# Patient Record
Sex: Female | Born: 1980 | Race: White | Hispanic: No | Marital: Single | Smoking: Light tobacco smoker
Health system: Southern US, Community
[De-identification: ages and names within clinical notes are randomized; demographics above are authoritative.]

## PROBLEM LIST (undated history)

## (undated) DIAGNOSIS — F32A Depression, unspecified: Secondary | ICD-10-CM

## (undated) DIAGNOSIS — J342 Deviated nasal septum: Secondary | ICD-10-CM

## (undated) DIAGNOSIS — D649 Anemia, unspecified: Secondary | ICD-10-CM

## (undated) DIAGNOSIS — J189 Pneumonia, unspecified organism: Secondary | ICD-10-CM

## (undated) DIAGNOSIS — G5792 Unspecified mononeuropathy of left lower limb: Secondary | ICD-10-CM

## (undated) DIAGNOSIS — K219 Gastro-esophageal reflux disease without esophagitis: Secondary | ICD-10-CM

## (undated) DIAGNOSIS — C801 Malignant (primary) neoplasm, unspecified: Secondary | ICD-10-CM

## (undated) DIAGNOSIS — F909 Attention-deficit hyperactivity disorder, unspecified type: Secondary | ICD-10-CM

## (undated) DIAGNOSIS — R002 Palpitations: Secondary | ICD-10-CM

## (undated) DIAGNOSIS — F410 Panic disorder [episodic paroxysmal anxiety] without agoraphobia: Secondary | ICD-10-CM

## (undated) DIAGNOSIS — I10 Essential (primary) hypertension: Secondary | ICD-10-CM

## (undated) DIAGNOSIS — T4145XA Adverse effect of unspecified anesthetic, initial encounter: Secondary | ICD-10-CM

## (undated) DIAGNOSIS — F988 Other specified behavioral and emotional disorders with onset usually occurring in childhood and adolescence: Secondary | ICD-10-CM

## (undated) DIAGNOSIS — T8859XA Other complications of anesthesia, initial encounter: Secondary | ICD-10-CM

## (undated) DIAGNOSIS — I00 Rheumatic fever without heart involvement: Secondary | ICD-10-CM

## (undated) DIAGNOSIS — G43909 Migraine, unspecified, not intractable, without status migrainosus: Secondary | ICD-10-CM

## (undated) DIAGNOSIS — M79605 Pain in left leg: Secondary | ICD-10-CM

## (undated) DIAGNOSIS — E162 Hypoglycemia, unspecified: Secondary | ICD-10-CM

## (undated) DIAGNOSIS — F329 Major depressive disorder, single episode, unspecified: Secondary | ICD-10-CM

## (undated) DIAGNOSIS — R011 Cardiac murmur, unspecified: Secondary | ICD-10-CM

## (undated) HISTORY — PX: COLPOSCOPY: SHX161

## (undated) HISTORY — PX: DILATION AND CURETTAGE OF UTERUS: SHX78

## (undated) HISTORY — DX: Major depressive disorder, single episode, unspecified: F32.9

## (undated) HISTORY — DX: Pain in left leg: M79.605

## (undated) HISTORY — PX: TUBAL LIGATION: SHX77

## (undated) HISTORY — DX: Depression, unspecified: F32.A

## (undated) HISTORY — PX: ABDOMINAL HYSTERECTOMY: SHX81

## (undated) HISTORY — PX: NASAL SEPTUM SURGERY: SHX37

---

## 2011-02-22 ENCOUNTER — Emergency Department (HOSPITAL_COMMUNITY): Payer: Self-pay

## 2011-02-22 ENCOUNTER — Encounter: Payer: Self-pay | Admitting: Emergency Medicine

## 2011-02-22 ENCOUNTER — Emergency Department (HOSPITAL_COMMUNITY)
Admission: EM | Admit: 2011-02-22 | Discharge: 2011-02-23 | Disposition: A | Discharge status: Home / Self Care | Payer: Self-pay | Attending: Emergency Medicine | Admitting: Emergency Medicine

## 2011-02-22 DIAGNOSIS — F17200 Nicotine dependence, unspecified, uncomplicated: Secondary | ICD-10-CM | POA: Insufficient documentation

## 2011-02-22 DIAGNOSIS — F411 Generalized anxiety disorder: Secondary | ICD-10-CM

## 2011-02-22 DIAGNOSIS — R030 Elevated blood-pressure reading, without diagnosis of hypertension: Secondary | ICD-10-CM

## 2011-02-22 DIAGNOSIS — F988 Other specified behavioral and emotional disorders with onset usually occurring in childhood and adolescence: Secondary | ICD-10-CM | POA: Insufficient documentation

## 2011-02-22 DIAGNOSIS — G43809 Other migraine, not intractable, without status migrainosus: Secondary | ICD-10-CM

## 2011-02-22 HISTORY — DX: Hypoglycemia, unspecified: E16.2

## 2011-02-22 HISTORY — DX: Other specified behavioral and emotional disorders with onset usually occurring in childhood and adolescence: F98.8

## 2011-02-22 HISTORY — DX: Panic disorder (episodic paroxysmal anxiety): F41.0

## 2011-02-22 HISTORY — DX: Migraine, unspecified, not intractable, without status migrainosus: G43.909

## 2011-02-22 HISTORY — DX: Rheumatic fever without heart involvement: I00

## 2011-02-22 LAB — URINALYSIS, ROUTINE W REFLEX MICROSCOPIC
Bilirubin Urine: NEGATIVE
Leukocytes, UA: NEGATIVE
Nitrite: NEGATIVE
Specific Gravity, Urine: 1.025 (ref 1.005–1.030)
Urobilinogen, UA: 0.2 mg/dL (ref 0.0–1.0)

## 2011-02-22 LAB — GLUCOSE, CAPILLARY

## 2011-02-22 LAB — POCT PREGNANCY, URINE: Preg Test, Ur: NEGATIVE

## 2011-02-22 MED ORDER — DIPHENHYDRAMINE HCL 12.5 MG/5ML PO ELIX
12.5000 mg | ORAL_SOLUTION | Freq: Once | ORAL | Status: AC
  Administered 2011-02-22: 12.5 mg via ORAL
  Filled 2011-02-22: qty 5

## 2011-02-22 MED ORDER — METOCLOPRAMIDE HCL 5 MG/ML IJ SOLN
10.0000 mg | Freq: Once | INTRAMUSCULAR | Status: AC
  Administered 2011-02-22: 10 mg via INTRAVENOUS
  Filled 2011-02-22: qty 2

## 2011-02-22 NOTE — ED Notes (Signed)
Patient's significant other report patient began c/o right arm numbness, slurred speech, and expressive aphasia. Symptom onset 2130. Patient with slow, slurred speech, eyes drooping, and very flat affect and appears lethargic. Patient reports it is hard for her words to come out.  20 gauge IV initiated in LAC. Blood work drawn upon IV initiation.   Dr Oletta Lamas at bedside for patient evaluation.

## 2011-02-22 NOTE — ED Notes (Signed)
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

## 2011-02-22 NOTE — ED Notes (Signed)
  Pt transported to ct 

## 2011-02-22 NOTE — ED Provider Notes (Addendum)
History    Scribed for Gavin Pound. Trinitey Roache, MD, the patient was seen in room APA17/APA17. This chart was scribed by Katha Cabal. This patient's care was started at 10:30PM.   CSN: [id number] Arrival date & time: 02/22/2011 10:05 PM  Chief Complaint  Patient presents with  . Hypertension  . Aphasia   HPI  A 30 year old female patient complains of confusion, Right hand tremors, mild aphasia onset an hour ago while at dinner. Pt also notes blurred vision both eyes and tingling in right arm and right leg, hot flashes, clammy, and pruritus, palpitations,  Dysphagia, chest tightness relieved with deep breathing.  Denies HA, change in gate, SOB, illness over the past week, increased stress.  Spouse noted slurred speech on the ride to emergency department.  Pt has history of migriaines and anxiety but Sx are not similar to current episode.  Pt also notes history of emotional panic attacks that she required medication when she was 11 years old.  Father had MI in his 79's. LMP 02/05/2011.  PAST MEDICAL HISTORY:  Past Medical History  Diagnosis Date  . ADD (attention deficit disorder)   . Rheumatic fever   . Hypoglycemia     PAST SURGICAL HISTORY:  Tubal Ligation   MEDICATIONS:  Previous Medications   Adderol     ALLERGIES:  Allergies as of 02/22/2011  . (Not on File)     FAMILY HISTORY:  Father with MI in 42's.  Mother with hypercholesteremia.   SOCIAL HISTORY: History   Social History  . Marital Status: Single    Spouse Name: N/A    Number of Children: 3  . Years of Education: N/A   Social History Main Topics  . Smoking status: Current Everyday Smoker -- 1.0 packs/day    Types: Cigarettes  . Smokeless tobacco: None  . Alcohol Use: Yes     occ  . Drug Use: No  . Sexually Active: Yes    Birth Control/ Protection: None   Other Topics Concern  . None   Social History Narrative  . None    Review of Systems 10 Systems reviewed and are negative for acute change except  as noted in the HPI. Physical Exam  BP 166/112  Pulse 98  Temp(Src) 98 F (36.7 C) (Oral)  Resp 20  Ht 5\' 6"  (1.676 m)  Wt 150 lb (68.04 kg)  BMI 24.21 kg/m2  SpO2 97%  Physical Exam  Nursing note and vitals reviewed. Constitutional: She is oriented to person, place, and time. She appears well-developed and well-nourished.  HENT:  Head: Normocephalic and atraumatic.  Mouth/Throat: Oropharynx is clear and moist.  Eyes: EOM are normal. Pupils are equal, round, and reactive to light.  Neck: Neck supple.  Cardiovascular: Normal rate, regular rhythm and normal heart sounds.   No murmur heard. Pulmonary/Chest: Effort normal and breath sounds normal. She has no wheezes.  Abdominal: Soft. Bowel sounds are normal. She exhibits no distension. There is no tenderness.  Musculoskeletal: Normal range of motion. She exhibits no edema.       DP and radial pulses intact.   Lymphadenopathy:    She has no cervical adenopathy.  Neurological: She is oriented to person, place, and time.       3/5 strength upper extremities  4/5 strength lower extremities   Skin: Skin is warm and dry.  Psychiatric: She has a normal mood and affect. Her behavior is normal.    ED Course  Procedures  . OTHER DATA REVIEWED:  Nursing notes, vital signs, and past medical records reviewed.    DIAGNOSTIC STUDIES:     LABS / RADIOLOGY: Results for orders placed during the hospital encounter of 02/22/11  GLUCOSE, CAPILLARY      Component Value Range   Glucose-Capillary 107 (*) 70 - 99 (mg/dL)   Comment 1 Documented in Chart     Comment 2 Notify RN    URINALYSIS, ROUTINE W REFLEX MICROSCOPIC      Component Value Range   Color, Urine YELLOW  YELLOW    Appearance CLEAR  CLEAR    Specific Gravity, Urine 1.025  1.005 - 1.030    pH 6.0  5.0 - 8.0    Glucose, UA NEGATIVE  NEGATIVE (mg/dL)   Hgb urine dipstick NEGATIVE  NEGATIVE    Bilirubin Urine NEGATIVE  NEGATIVE    Ketones, ur NEGATIVE  NEGATIVE (mg/dL)    Protein, ur NEGATIVE  NEGATIVE (mg/dL)   Urobilinogen, UA 0.2  0.0 - 1.0 (mg/dL)   Nitrite NEGATIVE  NEGATIVE    Leukocytes, UA NEGATIVE  NEGATIVE   POCT PREGNANCY, URINE      Component Value Range   Preg Test, Ur NEGATIVE         ED COURSE / COORDINATION OF CARE: 11:42 PM Spouse came to me and reports pt is already feeling better and is able to move her legs much better than even in the room from before and seems to be thinking clearer.  If CT scan is ok and pt is resolved in her symptoms, will d/c home.     MDM: The patient presents with atypical symptoms of possible neurologic episode. The patient was eating dinner with spells about one to 2 hours prior and had sudden onset of some difficulty swallowing associated with right arm tremors and subjective numbness as well as pain. The patient had a steady gait. The patient did have some slight slurring or difficulty speaking per spouse while at dinner. En route to the hospital the patient reports some bilateral eye blurred vision. On exam here in the emergency department the patient has bilateral weakness with poor effort both in testing upper and lower extremities. Patient is noted to have some tremors of the right hand although at times the patient has normal purposeful movement of the right arm arm when she scratched her left arm. Initially on exam patient has quite poor effort when testing for strength but with encouragement the patient did display some weak strength in both arms and legs symmetrically. The patient also has very few risk factors for stroke including her young age and no significant family history of strokes. The patient does smoke, she is not on any birth control hormone replacement. Given her exam I have very low clinical suspicion for any type of stroke or seizure. The patient does have a history of migraines as well as anxiety and panic attacks when she was younger. Both of these conditions may cause supple neurologic cold  symptoms as displayed here today. After discussing my suspicions with the patient and her spouse the patient does seem more relaxed and in my opinion her movements and concentration seems to be improving. I do not feel the patient is a TPA candidate as I have very low suspicion that this is a true cerebrovascular accident. I will have her perform a noncontrast head CT as well as treat her for presumptive atypical migraine without specific headache. We'll continue to observe her. Her initial Accu-Chek urinalysis and pregnancy test were negative.  12:11 AM I reviewed pt's head CT and per radiologist, head CT scan is normal.    12:18 AM Pt is now laughing and joking with spouse in the room, no noticeable tremors, reports she feels 10 times better, no headache, no blurred vision, no subjective numbness.  Also repeat of BP reading is improved although not completely normal.  I suspect pt may have had combination of stress and possibly atypical complex migraine.  If pt can ambulate fine, will d/c home.    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  Her tremors could have been related to that.  I personally performed the services described in this documentation, which was scribed in my presence. The recorded information has been reviewed and considered. Gavin Pound. Oletta Lamas, MD   Gavin Pound. Oletta Lamas, MD 02/23/11 1610  Gavin Pound. Oletta Lamas, MD 02/23/11 [id number]

## 2011-02-23 ENCOUNTER — Encounter (HOSPITAL_COMMUNITY): Payer: Self-pay | Admitting: Emergency Medicine

## 2011-02-23 NOTE — Discharge Instructions (Signed)
 Your head CT scan was normal.  I suspect you had a variant of a migraine that can cause people to shows signs and symptoms of a stroke.  I recommend that you have your blood pressure checked again in a few days when you are feeling normal and not under any stress, preferably at your doctor's office.  If you develop new symptoms, please call your doctor or return if needed.

## 2011-02-23 NOTE — ED Notes (Signed)
Pt ambulated to bathroom without incident.

## 2011-02-26 ENCOUNTER — Encounter (HOSPITAL_COMMUNITY): Payer: Self-pay | Admitting: *Deleted

## 2012-09-13 IMAGING — CT CT HEAD W/O CM
1 series · 16 of 30 positions shown, 20 images · non-contrast
Comparison: None.

CLINICAL DATA: Hypertension.  Difficulty speaking and right arm
numbness.

CT HEAD WITHOUT CONTRAST
TECHNIQUE: Contiguous axial images were obtained from the base of
the skull through the vertex without contrast.

[Series 3: headseq 4.8 h37s · axial · 0.43mm/px · z∈[-649,-495]mm · 16 of 36 slices shown, 20 images]
[im 2/36  brain]
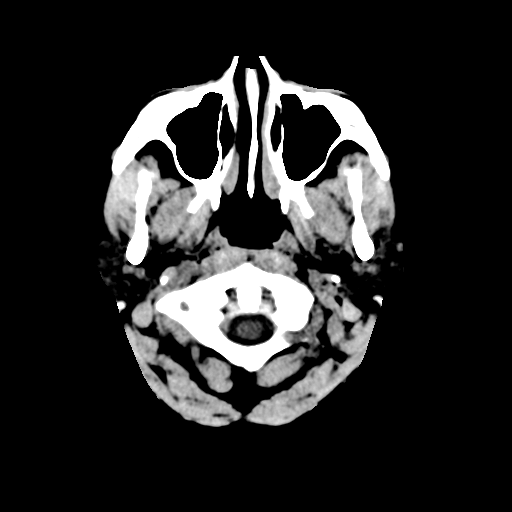
[im 2/36  bone]
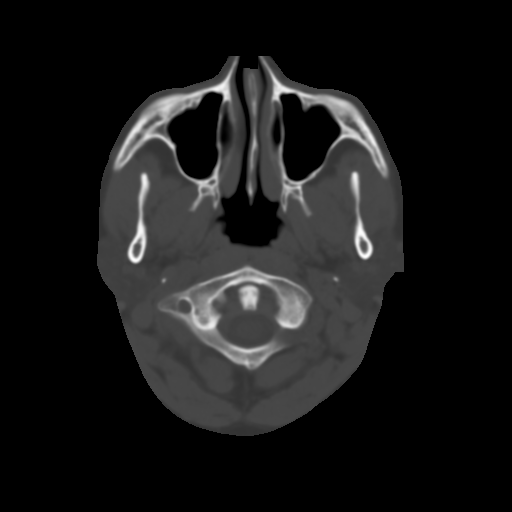
[im 4/36  brain]
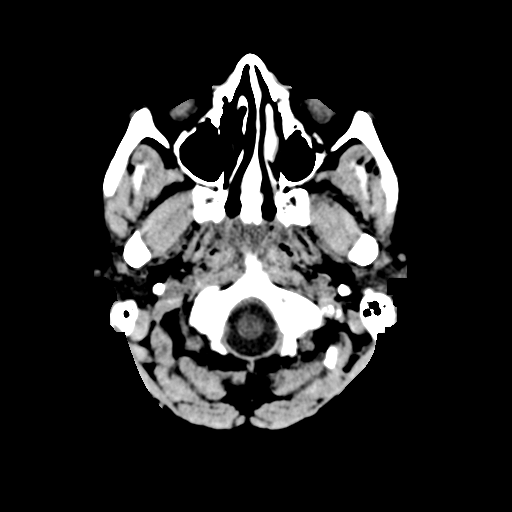
[im 7/36  brain]
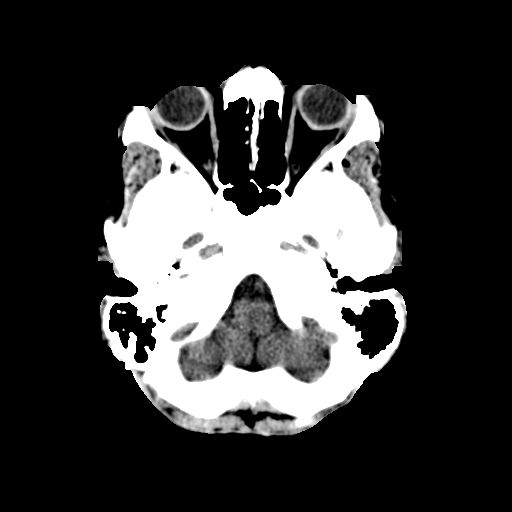
[im 9/36  brain]
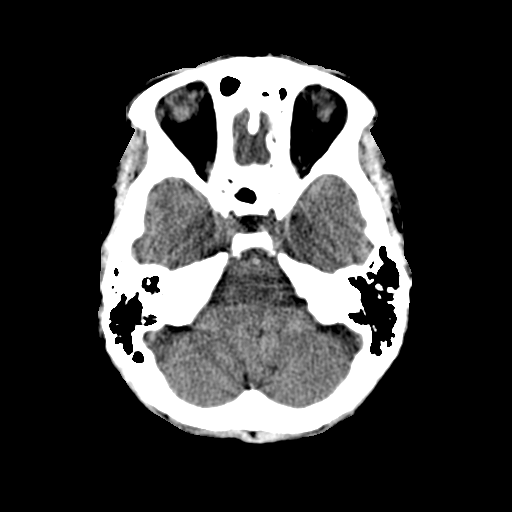
[im 10/36  brain]
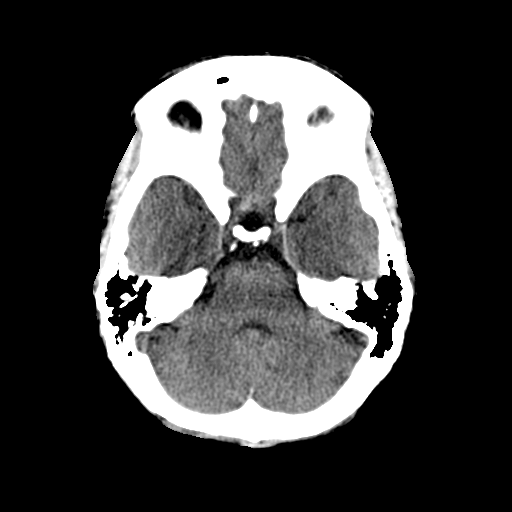
[im 10/36  bone]
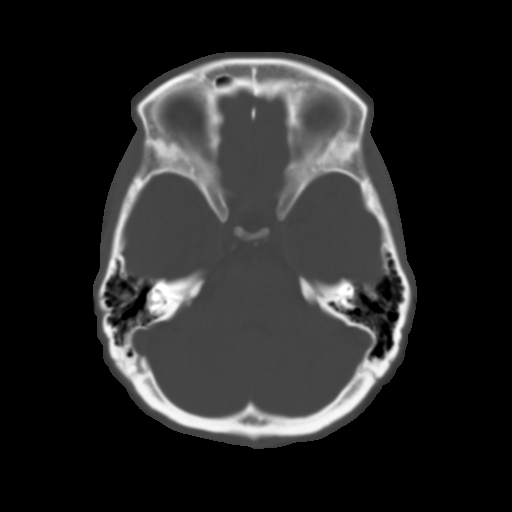
[im 13/36  brain]
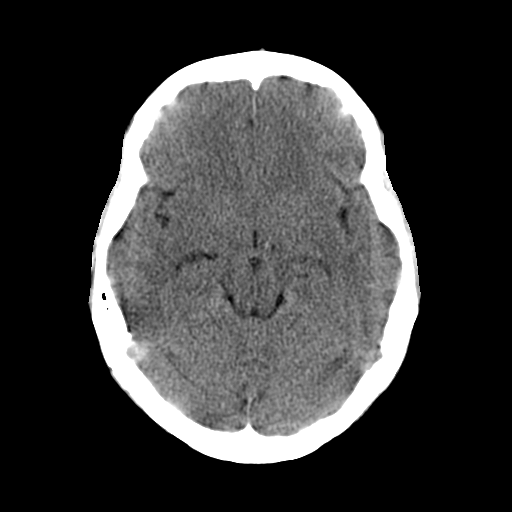
[im 15/36  brain]
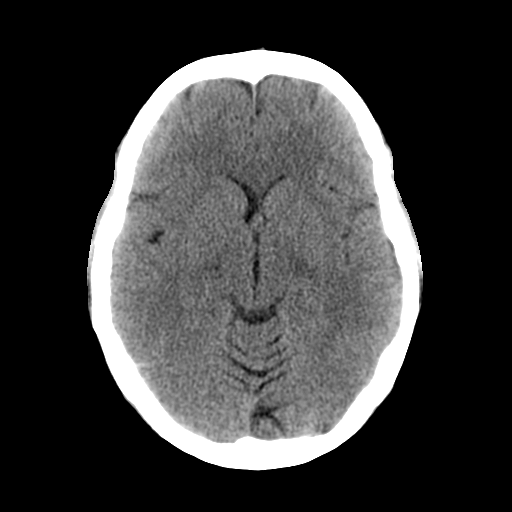
[im 17/36  brain]
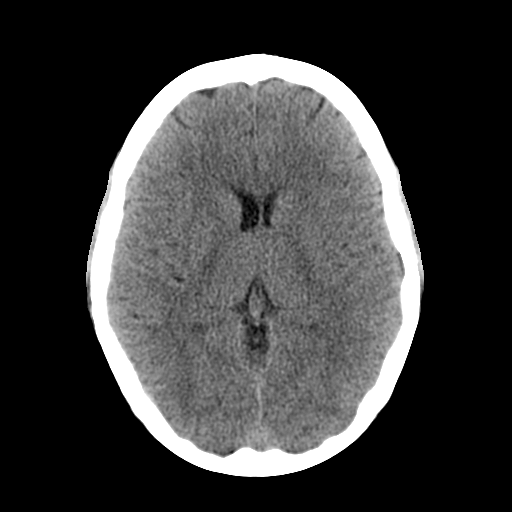
[im 19/36  brain]
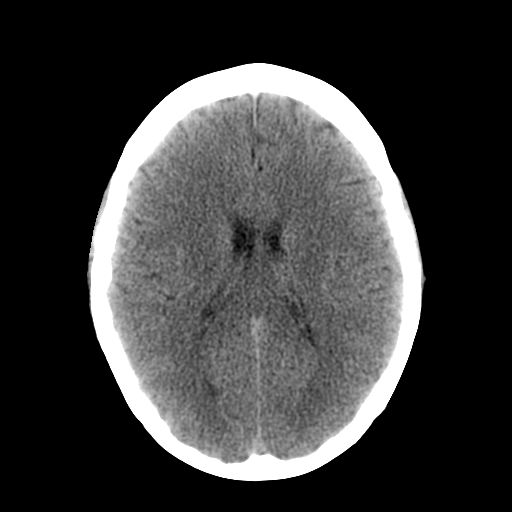
[im 19/36  bone]
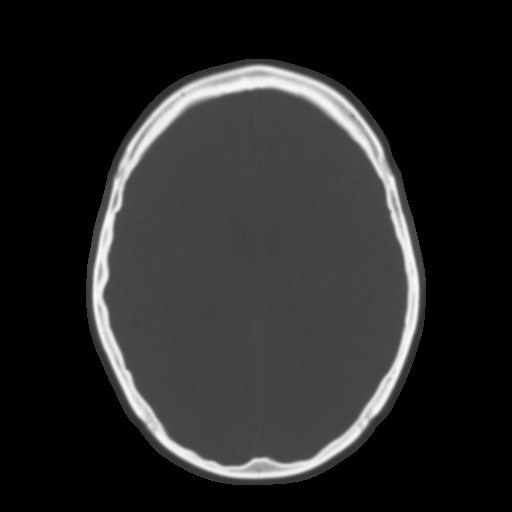
[im 21/36  brain]
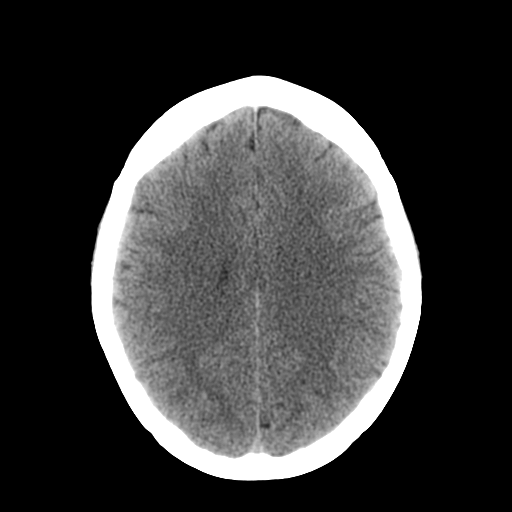
[im 23/36  brain]
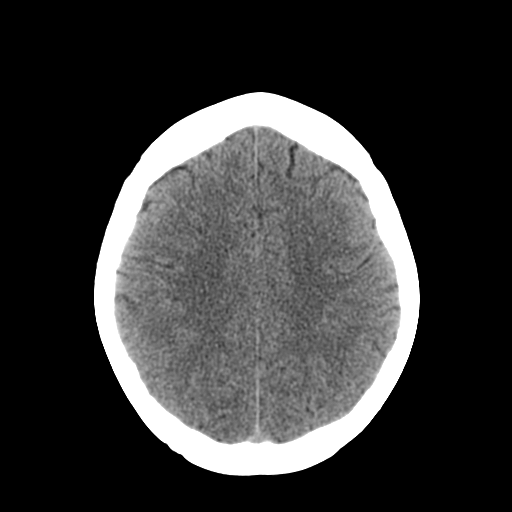
[im 26/36  brain]
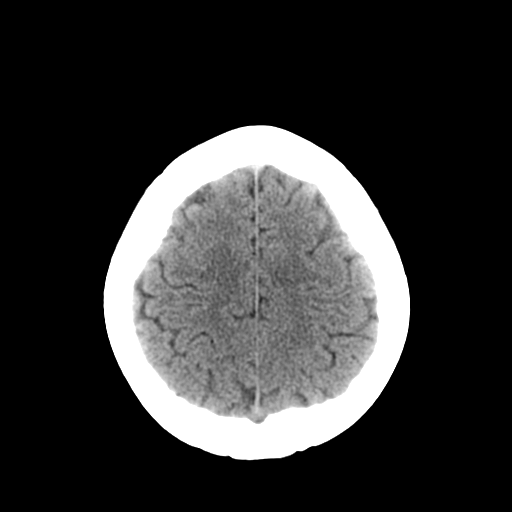
[im 27/36  brain]
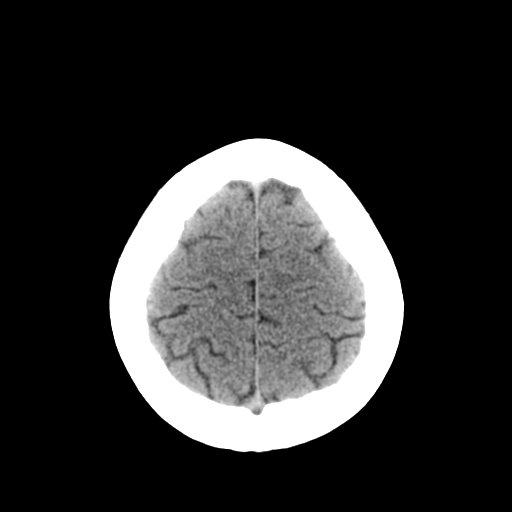
[im 27/36  bone]
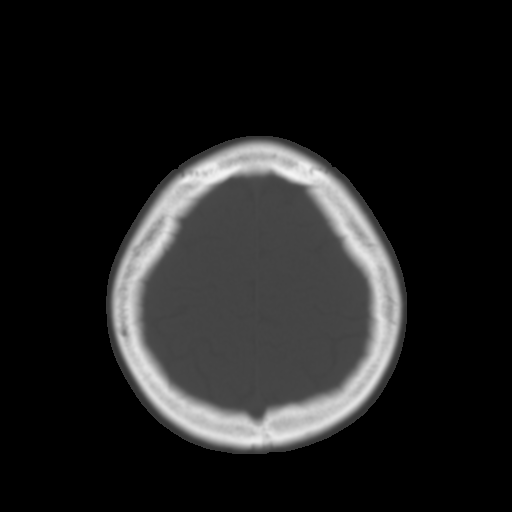
[im 29/36  brain]
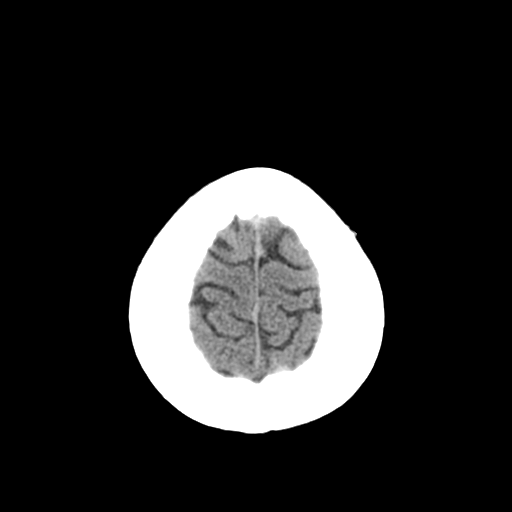
[im 32/36  brain]
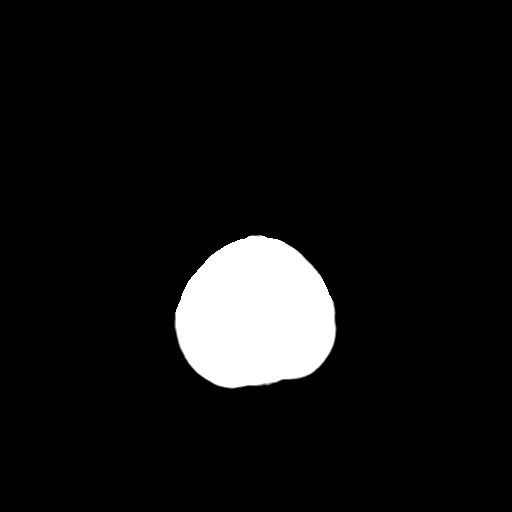
[im 34/36  brain]
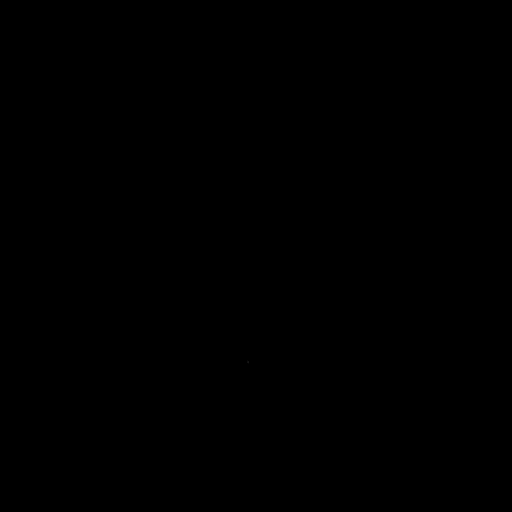

[16 of 30 positions shown; findings below may reference images not displayed]

FINDINGS: The brain appears normal without evidence of acute
infarction, hemorrhage, mass lesion, mass effect, midline shift or
abnormal extra-axial fluid collection.  No pneumocephalus or
hydrocephalus.  Imaged paranasal sinuses and mastoid air cells
clear.  Calvarium intact.
IMPRESSION: Normal exam.

## 2013-04-28 ENCOUNTER — Encounter (HOSPITAL_COMMUNITY): Payer: Self-pay | Admitting: Emergency Medicine

## 2013-04-28 ENCOUNTER — Emergency Department (HOSPITAL_COMMUNITY)
Admission: EM | Admit: 2013-04-28 | Discharge: 2013-04-28 | Disposition: A | Discharge status: Home / Self Care | Payer: Self-pay | Attending: Emergency Medicine | Admitting: Emergency Medicine

## 2013-04-28 DIAGNOSIS — R6884 Jaw pain: Secondary | ICD-10-CM | POA: Insufficient documentation

## 2013-04-28 DIAGNOSIS — Z3202 Encounter for pregnancy test, result negative: Secondary | ICD-10-CM | POA: Insufficient documentation

## 2013-04-28 DIAGNOSIS — Z8639 Personal history of other endocrine, nutritional and metabolic disease: Secondary | ICD-10-CM | POA: Insufficient documentation

## 2013-04-28 DIAGNOSIS — H60399 Other infective otitis externa, unspecified ear: Secondary | ICD-10-CM | POA: Insufficient documentation

## 2013-04-28 DIAGNOSIS — Z8659 Personal history of other mental and behavioral disorders: Secondary | ICD-10-CM | POA: Insufficient documentation

## 2013-04-28 DIAGNOSIS — N390 Urinary tract infection, site not specified: Secondary | ICD-10-CM | POA: Insufficient documentation

## 2013-04-28 DIAGNOSIS — H6091 Unspecified otitis externa, right ear: Secondary | ICD-10-CM

## 2013-04-28 DIAGNOSIS — Z8679 Personal history of other diseases of the circulatory system: Secondary | ICD-10-CM | POA: Insufficient documentation

## 2013-04-28 DIAGNOSIS — F17200 Nicotine dependence, unspecified, uncomplicated: Secondary | ICD-10-CM | POA: Insufficient documentation

## 2013-04-28 DIAGNOSIS — Z862 Personal history of diseases of the blood and blood-forming organs and certain disorders involving the immune mechanism: Secondary | ICD-10-CM | POA: Insufficient documentation

## 2013-04-28 LAB — URINALYSIS, ROUTINE W REFLEX MICROSCOPIC
Glucose, UA: NEGATIVE mg/dL
Ketones, ur: NEGATIVE mg/dL
Protein, ur: NEGATIVE mg/dL
Urobilinogen, UA: 0.2 mg/dL (ref 0.0–1.0)

## 2013-04-28 LAB — URINE MICROSCOPIC-ADD ON

## 2013-04-28 MED ORDER — NEOMYCIN-POLYMYXIN-HC 1 % OT SOLN
3.0000 [drp] | Freq: Four times a day (QID) | OTIC | Status: DC
  Administered 2013-04-28: 3 [drp] via OTIC

## 2013-04-28 MED ORDER — NEOMYCIN-POLYMYXIN-HC 1 % OT SOLN
OTIC | Status: AC
  Filled 2013-04-28: qty 10

## 2013-04-28 MED ORDER — HYDROCODONE-ACETAMINOPHEN 5-325 MG PO TABS: 1.0000 | ORAL_TABLET | ORAL | Status: DC | PRN

## 2013-04-28 MED ORDER — DICLOFENAC SODIUM 75 MG PO TBEC: 75.0000 mg | DELAYED_RELEASE_TABLET | Freq: Two times a day (BID) | ORAL | Status: DC

## 2013-04-28 MED ORDER — CEPHALEXIN 500 MG PO CAPS: 500.0000 mg | ORAL_CAPSULE | Freq: Four times a day (QID) | ORAL | Status: DC

## 2013-04-28 MED ORDER — HYDROCODONE-ACETAMINOPHEN 5-325 MG PO TABS
1.0000 | ORAL_TABLET | Freq: Once | ORAL | Status: AC
  Administered 2013-04-28: 1 via ORAL
  Filled 2013-04-28: qty 1

## 2013-04-28 MED ORDER — KETOROLAC TROMETHAMINE 10 MG PO TABS
10.0000 mg | ORAL_TABLET | Freq: Once | ORAL | Status: AC
  Administered 2013-04-28: 10 mg via ORAL
  Filled 2013-04-28: qty 1

## 2013-04-28 MED ORDER — CEPHALEXIN 500 MG PO CAPS
500.0000 mg | ORAL_CAPSULE | Freq: Once | ORAL | Status: AC
  Administered 2013-04-28: 500 mg via ORAL
  Filled 2013-04-28: qty 1

## 2013-04-28 NOTE — ED Notes (Signed)
Pt c/o rt sided jaw pain and ear pain. Pt also c/o dysuria for a couple of days.

## 2013-04-28 NOTE — ED Provider Notes (Signed)
Medical screening examination/treatment/procedure(s) were performed by non-physician practitioner and as supervising physician I was immediately available for consultation/collaboration.   Shelda Jakes, MD 04/28/13 2026

## 2013-04-28 NOTE — ED Provider Notes (Signed)
CSN: [id number]     Arrival date & time 04/28/13  1839 History   First MD Initiated Contact with Patient 04/28/13 1932     Chief Complaint  Patient presents with  . Jaw Pain  . Otalgia  . Dysuria   (Consider location/radiation/quality/duration/timing/severity/associated sxs/prior Treatment) Patient is a 32 y.o. female presenting with ear pain and dysuria. The history is provided by the patient.  Otalgia Location:  Right Behind ear:  No abnormality Quality:  Aching Severity:  Moderate Onset quality:  Gradual Duration:  4 days Timing:  Intermittent Progression:  Worsening Chronicity:  New Context: not direct blow, not foreign body in ear and not loud noise   Relieved by:  Nothing Worsened by:  Nothing tried Ineffective treatments:  None tried Associated symptoms: congestion   Associated symptoms: no abdominal pain, no cough, no fever and no neck pain   Associated symptoms comment:  Right jaw pain. Risk factors: no recent travel   Dysuria Associated symptoms: no abdominal pain and no fever     Past Medical History  Diagnosis Date  . ADD (attention deficit disorder)   . Rheumatic fever   . Hypoglycemia   . Panic attack   . Migraine headache    Past Surgical History  Procedure Laterality Date  . Tubal ligation    . Dilation and curettage of uterus     History reviewed. No pertinent family history. History  Substance Use Topics  . Smoking status: Current Every Day Smoker -- 1.00 packs/day    Types: Cigarettes  . Smokeless tobacco: Not on file  . Alcohol Use: No     Comment: occ   OB History   Grav Para Term Preterm Abortions TAB SAB Ect Mult Living                 Review of Systems  Constitutional: Negative for fever and activity change.       All ROS Neg except as noted in HPI  HENT: Positive for congestion and ear pain. Negative for nosebleeds.   Eyes: Negative for photophobia and discharge.  Respiratory: Negative for cough, shortness of breath and  wheezing.   Cardiovascular: Negative for chest pain and palpitations.  Gastrointestinal: Negative for abdominal pain and blood in stool.  Genitourinary: Positive for dysuria. Negative for frequency and hematuria.  Musculoskeletal: Positive for arthralgias. Negative for back pain and neck pain.  Skin: Negative.   Neurological: Negative for dizziness, seizures and speech difficulty.  Psychiatric/Behavioral: Negative for hallucinations and confusion. The patient is nervous/anxious.     Allergies  Review of patient's allergies indicates no known allergies.  Home Medications  No current outpatient prescriptions on file. BP 136/92  Pulse 68  Temp(Src) 98 F (36.7 C) (Oral)  Resp 18  SpO2 99%  LMP 04/06/2013 Physical Exam  Nursing note and vitals reviewed. Constitutional: She is oriented to person, place, and time. She appears well-developed and well-nourished.  Non-toxic appearance.  HENT:  Head: Normocephalic.    Right Ear: Tympanic membrane and external ear normal.  Left Ear: Tympanic membrane and external ear normal.  Ears:  There is mild increased redness just inside the external auditory canal at the 3:00 position. There is also question of a small red area at the tip of the tragus. There is no pain to the angle of the jaw. There is no pain to palpation or percussion of the teeth of the upper or lower jaw on the right.  Eyes: EOM and lids are normal. Pupils are  equal, round, and reactive to light.  Neck: Normal range of motion. Neck supple. Carotid bruit is not present.  Cardiovascular: Normal rate, regular rhythm, normal heart sounds, intact distal pulses and normal pulses.   Pulmonary/Chest: Breath sounds normal. No respiratory distress.  Abdominal: Soft. Bowel sounds are normal. There is no tenderness. There is no guarding.  Musculoskeletal: Normal range of motion.  Lymphadenopathy:       Head (right side): No submandibular adenopathy present.       Head (left side): No  submandibular adenopathy present.    She has no cervical adenopathy.  Neurological: She is alert and oriented to person, place, and time. She has normal strength. No cranial nerve deficit or sensory deficit.  Skin: Skin is warm and dry.  Psychiatric: She has a normal mood and affect. Her speech is normal.    ED Course  Procedures (including critical care time) Labs Review Labs Reviewed  URINALYSIS, ROUTINE W REFLEX MICROSCOPIC - Abnormal; Notable for the following:    Specific Gravity, Urine >1.030 (*)    Leukocytes, UA TRACE (*)    All other components within normal limits  URINE MICROSCOPIC-ADD ON - Abnormal; Notable for the following:    Squamous Epithelial / LPF FEW (*)    Bacteria, UA MANY (*)    All other components within normal limits  URINE CULTURE  PREGNANCY, URINE   Imaging Review No results found.  EKG Interpretation   None       MDM  No diagnosis found. *I have reviewed nursing notes, vital signs, and all appropriate lab and imaging results for this patient.** Labs reviewed. Pt noted to have UTI.                                                                                                       Pt advised to ciprofloxacin otic drops 2 times daily to the right ear. She is to use diclofenac 2 times daily with food. She will use Norco for pain, and Keflex 4 times daily for infection. Patient was advised of urinary tract infection being present, and advised to have urine rechecked in about 10 days.   Kathie Dike, PA-C 04/28/13 2025

## 2013-04-30 LAB — URINE CULTURE

## 2013-05-02 ENCOUNTER — Telehealth (HOSPITAL_COMMUNITY): Payer: Self-pay | Admitting: Emergency Medicine

## 2013-05-02 NOTE — ED Notes (Signed)
Post ED Visit - Positive Culture Follow-up  Culture report reviewed by antimicrobial stewardship pharmacist: []  Wes Dulaney, Pharm.D., BCPS []  Celedonio Miyamoto, Pharm.D., BCPS []  Georgina Pillion, Pharm.D., BCPS []  Amanda, [address].D., BCPS, AAHIVP []  Estella Husk, Pharm.D., BCPS, AAHIVP [x]  Nadara Mustard, [address].D., BCPS  Positive urine culture Treated with Keflex, organism sensitive to the same and no further patient follow-up is required at this time.  Kylie A Holland 05/02/2013, 9:43 AM

## 2013-05-15 ENCOUNTER — Encounter (HOSPITAL_COMMUNITY): Payer: Self-pay | Admitting: Emergency Medicine

## 2013-05-15 ENCOUNTER — Emergency Department (HOSPITAL_COMMUNITY)
Admission: EM | Admit: 2013-05-15 | Discharge: 2013-05-15 | Disposition: A | Discharge status: Home / Self Care | Payer: Self-pay | Attending: Emergency Medicine | Admitting: Emergency Medicine

## 2013-05-15 DIAGNOSIS — Z8659 Personal history of other mental and behavioral disorders: Secondary | ICD-10-CM | POA: Insufficient documentation

## 2013-05-15 DIAGNOSIS — Z8679 Personal history of other diseases of the circulatory system: Secondary | ICD-10-CM | POA: Insufficient documentation

## 2013-05-15 DIAGNOSIS — E669 Obesity, unspecified: Secondary | ICD-10-CM | POA: Insufficient documentation

## 2013-05-15 DIAGNOSIS — G43909 Migraine, unspecified, not intractable, without status migrainosus: Secondary | ICD-10-CM | POA: Insufficient documentation

## 2013-05-15 DIAGNOSIS — Z79899 Other long term (current) drug therapy: Secondary | ICD-10-CM | POA: Insufficient documentation

## 2013-05-15 DIAGNOSIS — F17200 Nicotine dependence, unspecified, uncomplicated: Secondary | ICD-10-CM | POA: Insufficient documentation

## 2013-05-15 DIAGNOSIS — J039 Acute tonsillitis, unspecified: Secondary | ICD-10-CM | POA: Insufficient documentation

## 2013-05-15 DIAGNOSIS — Z792 Long term (current) use of antibiotics: Secondary | ICD-10-CM | POA: Insufficient documentation

## 2013-05-15 MED ORDER — CLINDAMYCIN HCL 150 MG PO CAPS: 150.0000 mg | ORAL_CAPSULE | Freq: Four times a day (QID) | ORAL | Status: DC

## 2013-05-15 MED ORDER — LIDOCAINE HCL (PF) 1 % IJ SOLN
INTRAMUSCULAR | Status: AC
  Administered 2013-05-15: 15:00:00
  Filled 2013-05-15: qty 5

## 2013-05-15 MED ORDER — OXYCODONE-ACETAMINOPHEN 5-325 MG PO TABS
1.0000 | ORAL_TABLET | Freq: Once | ORAL | Status: AC
  Administered 2013-05-15: 1 via ORAL
  Filled 2013-05-15: qty 1

## 2013-05-15 MED ORDER — HYDROCODONE-ACETAMINOPHEN 5-325 MG PO TABS: 1.0000 | ORAL_TABLET | ORAL | Status: DC | PRN

## 2013-05-15 MED ORDER — CEFTRIAXONE SODIUM 1 G IJ SOLR
1.0000 g | Freq: Once | INTRAMUSCULAR | Status: AC
  Administered 2013-05-15: 1 g via INTRAMUSCULAR
  Filled 2013-05-15: qty 10

## 2013-05-15 NOTE — ED Notes (Signed)
Pt c/o sore throat and generalized body aches, and fever x 2 days.  Reports has been around people that have traveled internationally but none to [place].  Reports was seen here a couple of weeks ago for r earache.

## 2013-05-23 NOTE — ED Provider Notes (Signed)
CSN: [id number]     Arrival date & time 05/15/13  1332 History   First MD Initiated Contact with Patient 05/15/13 1409     Chief Complaint  Patient presents with  . Sore Throat   (Consider location/radiation/quality/duration/timing/severity/associated sxs/prior Treatment) HPI 32 y.o female complaining of sore throat for several days.  She was seen here with ear pain last week and has been taking clindamycin.  She has had some subjective fever.  Pain with speaking and swallowing that is not lateralized. No change in voice, lateral swelling, or neck stiffness.   Past Medical History  Diagnosis Date  . ADD (attention deficit disorder)   . Rheumatic fever   . Hypoglycemia   . Panic attack   . Migraine headache    Past Surgical History  Procedure Laterality Date  . Tubal ligation    . Dilation and curettage of uterus     No family history on file. History  Substance Use Topics  . Smoking status: Current Every Day Smoker -- 1.00 packs/day    Types: Cigarettes  . Smokeless tobacco: Not on file  . Alcohol Use: No     Comment: occ   OB History   Grav Para Term Preterm Abortions TAB SAB Ect Mult Living                 Review of Systems  All other systems reviewed and are negative.    Allergies  Review of patient's allergies indicates no known allergies.  Home Medications   Current Outpatient Rx  Name  Route  Sig  Dispense  Refill  . cephALEXin (KEFLEX) 500 MG capsule   Oral   Take 1 capsule (500 mg total) by mouth 4 (four) times daily.   28 capsule   0   . diclofenac (VOLTAREN) 75 MG EC tablet   Oral   Take 1 tablet (75 mg total) by mouth 2 (two) times daily.   12 tablet   0   . ibuprofen (ADVIL,MOTRIN) 200 MG tablet   Oral   Take 400 mg by mouth every 6 (six) hours as needed for pain.         . clindamycin (CLEOCIN) 150 MG capsule   Oral   Take 1 capsule (150 mg total) by mouth every 6 (six) hours.   28 capsule   0   . HYDROcodone-acetaminophen  (NORCO/VICODIN) 5-325 MG per tablet   Oral   Take 1 tablet by mouth every 4 (four) hours as needed.   15 tablet   0    BP 137/86  Pulse 99  Temp(Src) 98.2 F (36.8 C) (Oral)  Resp 20  Ht 5\' 3"  (1.6 m)  Wt 138 lb (62.596 kg)  BMI 24.45 kg/m2  SpO2 99%  LMP 05/01/2013 Physical Exam  Nursing note and vitals reviewed. Constitutional: She is oriented to person, place, and time. She appears well-developed and well-nourished.  obese  HENT:  Head: Normocephalic and atraumatic.  Right Ear: Tympanic membrane and external ear normal.  Left Ear: Tympanic membrane normal.  Nose: Nose normal.  Mouth/Throat: Oropharynx is clear and moist.  Mild pharyngeal erythema  Eyes: Conjunctivae and EOM are normal. Pupils are equal, round, and reactive to light.  Neck: Normal range of motion. Neck supple. No JVD present. No tracheal deviation present.  Cardiovascular: Normal rate, regular rhythm, normal heart sounds and intact distal pulses.   Pulmonary/Chest: Effort normal and breath sounds normal. No stridor.  Abdominal: Bowel sounds are normal.  Musculoskeletal: Normal range  of motion.  Lymphadenopathy:    She has no cervical adenopathy.  Neurological: She is oriented to person, place, and time.  Skin: Skin is warm and dry.  Psychiatric: She has a normal mood and affect. Her behavior is normal. Judgment and thought content normal.    ED Course  Procedures (including critical care time) Labs Review Labs Reviewed - No data to display Imaging Review No results found.  EKG Interpretation   None       MDM   1. Tonsillitis with exudate       Hilario Quarry, MD 05/23/13 1003

## 2016-02-17 ENCOUNTER — Other Ambulatory Visit: Payer: Self-pay | Admitting: Obstetrics & Gynecology

## 2016-02-17 DIAGNOSIS — N63 Unspecified lump in unspecified breast: Secondary | ICD-10-CM

## 2016-02-23 ENCOUNTER — Ambulatory Visit
Admission: RE | Admit: 2016-02-23 | Discharge: 2016-02-23 | Disposition: A | Discharge status: Home / Self Care | Payer: 59 | Source: Ambulatory Visit | Attending: Obstetrics & Gynecology | Admitting: Obstetrics & Gynecology

## 2016-02-23 ENCOUNTER — Ambulatory Visit
Admission: RE | Admit: 2016-02-23 | Discharge: 2016-02-23 | Disposition: A | Discharge status: Home / Self Care | Payer: Self-pay | Source: Ambulatory Visit | Attending: Obstetrics & Gynecology | Admitting: Obstetrics & Gynecology

## 2016-02-23 ENCOUNTER — Other Ambulatory Visit: Payer: Self-pay | Admitting: Obstetrics & Gynecology

## 2016-02-23 DIAGNOSIS — N63 Unspecified lump in unspecified breast: Secondary | ICD-10-CM

## 2016-09-13 NOTE — Progress Notes (Signed)
Ms. Sato stated she was given a medication twice at [facility] during her tubal ligation that caused her to stop breathing, but, she is unsure what the medication was.  I have requested via fax a copy of the anesthesia report and operative notes from this procedure performed by Dr. Mickel Duhamel.

## 2016-09-30 ENCOUNTER — Other Ambulatory Visit: Payer: Self-pay | Admitting: Obstetrics & Gynecology

## 2016-09-30 NOTE — Patient Instructions (Signed)
Your procedure is scheduled on:  Wednesday, October 09, 2016  Enter through the [employer] [facility] at:  10:30 AM  Pick up the phone at the desk and dial [phone].  Call this number if you have problems the morning of surgery: [phone].  Remember: Do NOT eat food:  After Midnight Tuesday  Do NOT drink clear liquids after:  8:00 AM day of surgery  Take these medicines the morning of surgery with a SIP OF WATER:  None, Use nasal sprays per normal routine  Stop ALL herbal medications, Ibuprofen, and Naproxen at this time  Do NOT smoke the day of surgery.  Do NOT wear jewelry (body piercing), metal hair clips/bobby pins, make-up, or nail polish. Do NOT wear lotions, powders, or perfumes.  You may wear deodorant. Do NOT shave for 48 hours prior to surgery. Do NOT bring valuables to the hospital. Contacts, dentures, or bridgework may not be worn into surgery.  Leave suitcase in car.  After surgery it may be brought to your room.  For patients admitted to the hospital, checkout time is 11:00 AM the day of discharge.  Bring a copy of your healthcare power of attorney and living will documents.

## 2016-10-01 ENCOUNTER — Encounter (HOSPITAL_COMMUNITY)
Admission: RE | Admit: 2016-10-01 | Discharge: 2016-10-01 | Disposition: A | Discharge status: Home / Self Care | Payer: 59 | Source: Ambulatory Visit | Attending: Obstetrics & Gynecology | Admitting: Obstetrics & Gynecology

## 2016-10-01 ENCOUNTER — Encounter (HOSPITAL_COMMUNITY): Payer: Self-pay

## 2016-10-01 DIAGNOSIS — Z01812 Encounter for preprocedural laboratory examination: Secondary | ICD-10-CM | POA: Diagnosis present

## 2016-10-01 DIAGNOSIS — R102 Pelvic and perineal pain: Secondary | ICD-10-CM | POA: Insufficient documentation

## 2016-10-01 HISTORY — DX: Essential (primary) hypertension: I10

## 2016-10-01 HISTORY — DX: Palpitations: R00.2

## 2016-10-01 HISTORY — DX: Cardiac murmur, unspecified: R01.1

## 2016-10-01 HISTORY — DX: Gastro-esophageal reflux disease without esophagitis: K21.9

## 2016-10-01 HISTORY — DX: Anemia, unspecified: D64.9

## 2016-10-01 HISTORY — DX: Malignant (primary) neoplasm, unspecified: C80.1

## 2016-10-01 HISTORY — DX: Deviated nasal septum: J34.2

## 2016-10-01 HISTORY — DX: Pneumonia, unspecified organism: J18.9

## 2016-10-01 LAB — CBC
HEMATOCRIT: 41.6 % (ref 36.0–46.0)
HEMOGLOBIN: 13.8 g/dL (ref 12.0–15.0)
MCH: 31.1 pg (ref 26.0–34.0)
MCHC: 33.2 g/dL (ref 30.0–36.0)
MCV: 93.7 fL (ref 78.0–100.0)
Platelets: 245 10*3/uL (ref 150–400)
RBC: 4.44 MIL/uL (ref 3.87–5.11)
RDW: 13.2 % (ref 11.5–15.5)
WBC: 8.4 10*3/uL (ref 4.0–10.5)

## 2016-10-01 NOTE — Pre-Procedure Instructions (Signed)
Dr. Donald Prose spoke with Ms. Hollingworth about her anesthesia concerns.  I have left a message with the manager of medical records at [place] about the necessity of having access to her anesthesia and PACU records faxed to [place] prior to surgery.

## 2016-10-09 ENCOUNTER — Observation Stay (HOSPITAL_COMMUNITY)
Admission: RE | Admit: 2016-10-09 | Discharge: 2016-10-10 | Disposition: A | Discharge status: Home / Self Care | Payer: 59 | Source: Ambulatory Visit | Attending: Obstetrics & Gynecology | Admitting: Obstetrics & Gynecology

## 2016-10-09 ENCOUNTER — Encounter (HOSPITAL_COMMUNITY): Payer: Self-pay

## 2016-10-09 ENCOUNTER — Ambulatory Visit (HOSPITAL_COMMUNITY): Payer: 59 | Admitting: Anesthesiology

## 2016-10-09 ENCOUNTER — Encounter (HOSPITAL_COMMUNITY)
Admission: RE | Disposition: A | Discharge status: Home / Self Care | Payer: Self-pay | Source: Ambulatory Visit | Attending: Obstetrics & Gynecology

## 2016-10-09 DIAGNOSIS — D259 Leiomyoma of uterus, unspecified: Principal | ICD-10-CM | POA: Insufficient documentation

## 2016-10-09 DIAGNOSIS — Z79899 Other long term (current) drug therapy: Secondary | ICD-10-CM | POA: Diagnosis not present

## 2016-10-09 DIAGNOSIS — Z888 Allergy status to other drugs, medicaments and biological substances status: Secondary | ICD-10-CM | POA: Insufficient documentation

## 2016-10-09 DIAGNOSIS — N8301 Follicular cyst of right ovary: Secondary | ICD-10-CM | POA: Diagnosis not present

## 2016-10-09 DIAGNOSIS — J342 Deviated nasal septum: Secondary | ICD-10-CM | POA: Insufficient documentation

## 2016-10-09 DIAGNOSIS — I10 Essential (primary) hypertension: Secondary | ICD-10-CM | POA: Insufficient documentation

## 2016-10-09 DIAGNOSIS — F988 Other specified behavioral and emotional disorders with onset usually occurring in childhood and adolescence: Secondary | ICD-10-CM | POA: Diagnosis not present

## 2016-10-09 DIAGNOSIS — G43909 Migraine, unspecified, not intractable, without status migrainosus: Secondary | ICD-10-CM | POA: Diagnosis not present

## 2016-10-09 DIAGNOSIS — F17210 Nicotine dependence, cigarettes, uncomplicated: Secondary | ICD-10-CM | POA: Insufficient documentation

## 2016-10-09 DIAGNOSIS — N938 Other specified abnormal uterine and vaginal bleeding: Secondary | ICD-10-CM | POA: Insufficient documentation

## 2016-10-09 DIAGNOSIS — G8929 Other chronic pain: Secondary | ICD-10-CM | POA: Diagnosis not present

## 2016-10-09 DIAGNOSIS — E162 Hypoglycemia, unspecified: Secondary | ICD-10-CM | POA: Insufficient documentation

## 2016-10-09 DIAGNOSIS — R002 Palpitations: Secondary | ICD-10-CM | POA: Insufficient documentation

## 2016-10-09 DIAGNOSIS — K219 Gastro-esophageal reflux disease without esophagitis: Secondary | ICD-10-CM | POA: Insufficient documentation

## 2016-10-09 DIAGNOSIS — F410 Panic disorder [episodic paroxysmal anxiety] without agoraphobia: Secondary | ICD-10-CM | POA: Insufficient documentation

## 2016-10-09 DIAGNOSIS — N800 Endometriosis of uterus: Secondary | ICD-10-CM | POA: Diagnosis not present

## 2016-10-09 DIAGNOSIS — Z881 Allergy status to other antibiotic agents status: Secondary | ICD-10-CM | POA: Diagnosis not present

## 2016-10-09 DIAGNOSIS — Z7982 Long term (current) use of aspirin: Secondary | ICD-10-CM | POA: Diagnosis not present

## 2016-10-09 DIAGNOSIS — N946 Dysmenorrhea, unspecified: Secondary | ICD-10-CM | POA: Diagnosis present

## 2016-10-09 DIAGNOSIS — Z23 Encounter for immunization: Secondary | ICD-10-CM | POA: Insufficient documentation

## 2016-10-09 DIAGNOSIS — Z90710 Acquired absence of both cervix and uterus: Secondary | ICD-10-CM | POA: Diagnosis present

## 2016-10-09 DIAGNOSIS — R011 Cardiac murmur, unspecified: Secondary | ICD-10-CM | POA: Diagnosis not present

## 2016-10-09 DIAGNOSIS — Z8541 Personal history of malignant neoplasm of cervix uteri: Secondary | ICD-10-CM | POA: Insufficient documentation

## 2016-10-09 HISTORY — PX: TOTAL LAPAROSCOPIC HYSTERECTOMY WITH SALPINGECTOMY: SHX6742

## 2016-10-09 HISTORY — PX: CYSTOSCOPY: SHX5120

## 2016-10-09 LAB — PREGNANCY, URINE: PREG TEST UR: NEGATIVE

## 2016-10-09 SURGERY — HYSTERECTOMY, TOTAL, LAPAROSCOPIC, WITH SALPINGECTOMY
Anesthesia: General | Site: Bladder

## 2016-10-09 MED ORDER — OXYCODONE-ACETAMINOPHEN 5-325 MG PO TABS
1.0000 | ORAL_TABLET | ORAL | Status: DC | PRN
  Administered 2016-10-09 – 2016-10-10 (×3): 2 via ORAL
  Filled 2016-10-09 (×3): qty 2

## 2016-10-09 MED ORDER — MENTHOL 3 MG MT LOZG: 1.0000 | LOZENGE | OROMUCOSAL | Status: DC | PRN

## 2016-10-09 MED ORDER — LACTATED RINGERS IV SOLN
INTRAVENOUS | Status: DC
  Administered 2016-10-09 (×2): via INTRAVENOUS

## 2016-10-09 MED ORDER — ESTRADIOL 0.05 MG/24HR TD PTWK
0.0500 mg | MEDICATED_PATCH | TRANSDERMAL | Status: DC
  Administered 2016-10-09: 0.05 mg via TRANSDERMAL
  Filled 2016-10-09: qty 1

## 2016-10-09 MED ORDER — PROPOFOL 10 MG/ML IV BOLUS
INTRAVENOUS | Status: AC
  Filled 2016-10-09: qty 20

## 2016-10-09 MED ORDER — METOCLOPRAMIDE HCL 5 MG/ML IJ SOLN: 10.0000 mg | Freq: Once | INTRAMUSCULAR | Status: DC | PRN

## 2016-10-09 MED ORDER — KETOROLAC TROMETHAMINE 30 MG/ML IJ SOLN
INTRAMUSCULAR | Status: AC
  Filled 2016-10-09: qty 1

## 2016-10-09 MED ORDER — MORPHINE SULFATE (PF) 4 MG/ML IV SOLN
1.0000 mg | INTRAVENOUS | Status: DC | PRN
  Administered 2016-10-09: 2 mg via INTRAVENOUS
  Filled 2016-10-09: qty 1

## 2016-10-09 MED ORDER — ONDANSETRON HCL 4 MG PO TABS: 4.0000 mg | ORAL_TABLET | Freq: Four times a day (QID) | ORAL | Status: DC | PRN

## 2016-10-09 MED ORDER — BUPIVACAINE HCL (PF) 0.25 % IJ SOLN
INTRAMUSCULAR | Status: AC
  Filled 2016-10-09: qty 30

## 2016-10-09 MED ORDER — KETOROLAC TROMETHAMINE 30 MG/ML IJ SOLN: 30.0000 mg | Freq: Four times a day (QID) | INTRAMUSCULAR | Status: DC

## 2016-10-09 MED ORDER — DEXAMETHASONE SODIUM PHOSPHATE 10 MG/ML IJ SOLN
INTRAMUSCULAR | Status: DC | PRN
  Administered 2016-10-09: 10 mg via INTRAVENOUS

## 2016-10-09 MED ORDER — ONDANSETRON HCL 4 MG/2ML IJ SOLN
INTRAMUSCULAR | Status: DC | PRN
  Administered 2016-10-09: 4 mg via INTRAVENOUS

## 2016-10-09 MED ORDER — LIDOCAINE HCL (CARDIAC) 20 MG/ML IV SOLN
INTRAVENOUS | Status: AC
  Filled 2016-10-09: qty 5

## 2016-10-09 MED ORDER — MIDAZOLAM HCL 2 MG/2ML IJ SOLN
INTRAMUSCULAR | Status: AC
  Filled 2016-10-09: qty 2

## 2016-10-09 MED ORDER — LACTATED RINGERS IV SOLN
INTRAVENOUS | Status: DC
  Administered 2016-10-09: 19:00:00 via INTRAVENOUS

## 2016-10-09 MED ORDER — SIMETHICONE 80 MG PO CHEW
80.0000 mg | CHEWABLE_TABLET | Freq: Four times a day (QID) | ORAL | Status: DC
  Administered 2016-10-10: 80 mg via ORAL
  Filled 2016-10-09 (×2): qty 1

## 2016-10-09 MED ORDER — PANTOPRAZOLE SODIUM 40 MG PO TBEC
40.0000 mg | DELAYED_RELEASE_TABLET | Freq: Every day | ORAL | Status: DC
  Administered 2016-10-10: 40 mg via ORAL
  Filled 2016-10-09: qty 1

## 2016-10-09 MED ORDER — MIDAZOLAM HCL 5 MG/5ML IJ SOLN
INTRAMUSCULAR | Status: DC | PRN
  Administered 2016-10-09: 2 mg via INTRAVENOUS

## 2016-10-09 MED ORDER — PROPOFOL 10 MG/ML IV BOLUS
INTRAVENOUS | Status: DC | PRN
  Administered 2016-10-09: 200 mg via INTRAVENOUS

## 2016-10-09 MED ORDER — SUGAMMADEX SODIUM 200 MG/2ML IV SOLN
INTRAVENOUS | Status: AC
  Filled 2016-10-09: qty 2

## 2016-10-09 MED ORDER — KETOROLAC TROMETHAMINE 30 MG/ML IJ SOLN
INTRAMUSCULAR | Status: DC | PRN
  Administered 2016-10-09: 30 mg via INTRAVENOUS

## 2016-10-09 MED ORDER — ZOLPIDEM TARTRATE 5 MG PO TABS: 5.0000 mg | ORAL_TABLET | Freq: Every evening | ORAL | Status: DC | PRN

## 2016-10-09 MED ORDER — FENTANYL CITRATE (PF) 100 MCG/2ML IJ SOLN
INTRAMUSCULAR | Status: AC
  Filled 2016-10-09: qty 2

## 2016-10-09 MED ORDER — FENTANYL CITRATE (PF) 100 MCG/2ML IJ SOLN
50.0000 ug | INTRAMUSCULAR | Status: DC | PRN
  Administered 2016-10-09: 50 ug via INTRAVENOUS

## 2016-10-09 MED ORDER — LIDOCAINE HCL (CARDIAC) 20 MG/ML IV SOLN
INTRAVENOUS | Status: DC | PRN
  Administered 2016-10-09: 100 mg via INTRAVENOUS

## 2016-10-09 MED ORDER — HYDROMORPHONE HCL 1 MG/ML IJ SOLN
INTRAMUSCULAR | Status: DC | PRN
Start: 2016-10-09 — End: 2016-10-09
  Administered 2016-10-09: 1 mg via INTRAVENOUS

## 2016-10-09 MED ORDER — BUPIVACAINE HCL (PF) 0.25 % IJ SOLN
INTRAMUSCULAR | Status: DC | PRN
  Administered 2016-10-09: 12 mL
  Administered 2016-10-09: 10 mL

## 2016-10-09 MED ORDER — MEPERIDINE HCL 25 MG/ML IJ SOLN: 6.2500 mg | INTRAMUSCULAR | Status: DC | PRN

## 2016-10-09 MED ORDER — SCOPOLAMINE 1 MG/3DAYS TD PT72
1.0000 | MEDICATED_PATCH | Freq: Once | TRANSDERMAL | Status: DC
  Administered 2016-10-09: 1.5 mg via TRANSDERMAL

## 2016-10-09 MED ORDER — SCOPOLAMINE 1 MG/3DAYS TD PT72
MEDICATED_PATCH | TRANSDERMAL | Status: DC
Start: 2016-10-09 — End: 2016-10-10
  Administered 2016-10-09: 1.5 mg via TRANSDERMAL
  Filled 2016-10-09: qty 1

## 2016-10-09 MED ORDER — FENTANYL CITRATE (PF) 100 MCG/2ML IJ SOLN
INTRAMUSCULAR | Status: DC | PRN
  Administered 2016-10-09 (×2): 25 ug via INTRAVENOUS
  Administered 2016-10-09: 100 ug via INTRAVENOUS
  Administered 2016-10-09: 150 ug via INTRAVENOUS
  Administered 2016-10-09 (×2): 25 ug via INTRAVENOUS

## 2016-10-09 MED ORDER — IBUPROFEN 800 MG PO TABS: 800.0000 mg | ORAL_TABLET | Freq: Three times a day (TID) | ORAL | Status: DC | PRN

## 2016-10-09 MED ORDER — HYDROMORPHONE HCL 1 MG/ML IJ SOLN
INTRAMUSCULAR | Status: AC
  Filled 2016-10-09: qty 1

## 2016-10-09 MED ORDER — ESTRADIOL 0.025 MG/24HR TD PTWK: 0.0250 mg | MEDICATED_PATCH | TRANSDERMAL | Status: DC

## 2016-10-09 MED ORDER — DEXAMETHASONE SODIUM PHOSPHATE 10 MG/ML IJ SOLN
INTRAMUSCULAR | Status: AC
  Filled 2016-10-09: qty 1

## 2016-10-09 MED ORDER — ROCURONIUM BROMIDE 100 MG/10ML IV SOLN
INTRAVENOUS | Status: AC
  Filled 2016-10-09: qty 1

## 2016-10-09 MED ORDER — LACTATED RINGERS IR SOLN
Status: DC | PRN
  Administered 2016-10-09: 3000 mL

## 2016-10-09 MED ORDER — STERILE WATER FOR IRRIGATION IR SOLN
Status: DC | PRN
  Administered 2016-10-09: 1000 mL

## 2016-10-09 MED ORDER — FENTANYL CITRATE (PF) 100 MCG/2ML IJ SOLN
25.0000 ug | INTRAMUSCULAR | Status: DC | PRN
  Administered 2016-10-09 (×3): 50 ug via INTRAVENOUS

## 2016-10-09 MED ORDER — CEFAZOLIN SODIUM-DEXTROSE 2-4 GM/100ML-% IV SOLN
2.0000 g | INTRAVENOUS | Status: AC
  Administered 2016-10-09: 2 g via INTRAVENOUS

## 2016-10-09 MED ORDER — LACTATED RINGERS IV SOLN: INTRAVENOUS | Status: DC

## 2016-10-09 MED ORDER — FENTANYL CITRATE (PF) 250 MCG/5ML IJ SOLN
INTRAMUSCULAR | Status: AC
  Filled 2016-10-09: qty 5

## 2016-10-09 MED ORDER — ONDANSETRON HCL 4 MG/2ML IJ SOLN: 4.0000 mg | Freq: Four times a day (QID) | INTRAMUSCULAR | Status: DC | PRN

## 2016-10-09 MED ORDER — ONDANSETRON HCL 4 MG/2ML IJ SOLN
INTRAMUSCULAR | Status: AC
  Filled 2016-10-09: qty 2

## 2016-10-09 MED ORDER — ROCURONIUM BROMIDE 100 MG/10ML IV SOLN
INTRAVENOUS | Status: DC | PRN
  Administered 2016-10-09: 50 mg via INTRAVENOUS
  Administered 2016-10-09: 10 mg via INTRAVENOUS
  Administered 2016-10-09 (×2): 20 mg via INTRAVENOUS

## 2016-10-09 MED ORDER — KETOROLAC TROMETHAMINE 30 MG/ML IJ SOLN
30.0000 mg | Freq: Four times a day (QID) | INTRAMUSCULAR | Status: DC
  Administered 2016-10-09 – 2016-10-10 (×3): 30 mg via INTRAVENOUS
  Filled 2016-10-09 (×3): qty 1

## 2016-10-09 MED ORDER — SUGAMMADEX SODIUM 200 MG/2ML IV SOLN
INTRAVENOUS | Status: DC | PRN
  Administered 2016-10-09: 200 mg via INTRAVENOUS

## 2016-10-09 SURGICAL SUPPLY — 55 items
APPLICATOR ARISTA FLEXITIP XL (MISCELLANEOUS) ×4 IMPLANT
BARRIER ADHS 3X4 INTERCEED (GAUZE/BANDAGES/DRESSINGS) ×4 IMPLANT
CABLE HIGH FREQUENCY MONO STRZ (ELECTRODE) ×4 IMPLANT
CLOTH BEACON ORANGE TIMEOUT ST (SAFETY) ×4 IMPLANT
COVER LIGHT HANDLE  1/PK (MISCELLANEOUS) ×4
COVER LIGHT HANDLE 1/PK (MISCELLANEOUS) ×4 IMPLANT
DEFOGGER SCOPE WARMER CLEARIFY (MISCELLANEOUS) ×4 IMPLANT
DERMABOND ADVANCED (GAUZE/BANDAGES/DRESSINGS) ×2
DERMABOND ADVANCED .7 DNX12 (GAUZE/BANDAGES/DRESSINGS) ×2 IMPLANT
DISSECTOR BLUNT TIP ENDO 5MM (MISCELLANEOUS) ×4 IMPLANT
DURAPREP 26ML APPLICATOR (WOUND CARE) ×4 IMPLANT
FILTER SMOKE EVAC LAPAROSHD (FILTER) ×4 IMPLANT
GLOVE BIO SURGEON STRL SZ 6.5 (GLOVE) ×3 IMPLANT
GLOVE BIO SURGEON STRL SZ7 (GLOVE) ×4 IMPLANT
GLOVE BIO SURGEONS STRL SZ 6.5 (GLOVE) ×1
GLOVE BIOGEL PI IND STRL 7.0 (GLOVE) ×10 IMPLANT
GLOVE BIOGEL PI INDICATOR 7.0 (GLOVE) ×10
HEMOSTAT ARISTA ABSORB 3G PWDR (MISCELLANEOUS) ×4 IMPLANT
LIGASURE VESSEL 5MM BLUNT TIP (ELECTROSURGICAL) ×3 IMPLANT
MANIPULATOR ADVINCU DEL 3.0 PL (MISCELLANEOUS) IMPLANT
MANIPULATOR ADVINCU DEL 3.5 PL (MISCELLANEOUS) ×4 IMPLANT
MANIPULATOR ADVINCU DEL 4.0 PL (MISCELLANEOUS) IMPLANT
NEEDLE INSUFFLATION 120MM (ENDOMECHANICALS) ×4 IMPLANT
NS IRRIG 1000ML POUR BTL (IV SOLUTION) ×4 IMPLANT
PACK LAPAROSCOPY BASIN (CUSTOM PROCEDURE TRAY) ×4 IMPLANT
PACK ROBOTIC GOWN (GOWN DISPOSABLE) ×4 IMPLANT
PACK TRENDGUARD 450 HYBRID PRO (MISCELLANEOUS) ×2 IMPLANT
PACK TRENDGUARD 600 HYBRD PROC (MISCELLANEOUS) IMPLANT
POUCH LAPAROSCOPIC INSTRUMENT (MISCELLANEOUS) ×4 IMPLANT
PROTECTOR NERVE ULNAR (MISCELLANEOUS) ×8 IMPLANT
SCISSORS LAP 5X35 DISP (ENDOMECHANICALS) ×4 IMPLANT
SET CYSTO W/LG BORE CLAMP LF (SET/KITS/TRAYS/PACK) ×4 IMPLANT
SET IRRIG TUBING LAPAROSCOPIC (IRRIGATION / IRRIGATOR) ×4 IMPLANT
SET TRI-LUMEN FLTR TB AIRSEAL (TUBING) IMPLANT
SLEEVE XCEL OPT CAN 5 100 (ENDOMECHANICALS) ×8 IMPLANT
SUT VIC AB 0 CT1 18XCR BRD8 (SUTURE) ×2 IMPLANT
SUT VIC AB 0 CT1 8-18 (SUTURE) ×2
SUT VIC AB 2-0 CT1 (SUTURE) IMPLANT
SUT VICRYL 0 UR6 27IN ABS (SUTURE) ×4 IMPLANT
SUT VICRYL 4-0 PS2 18IN ABS (SUTURE) ×4 IMPLANT
SUT VLOC 180 0 6IN GS21 (SUTURE) ×4 IMPLANT
SUT VLOC 180 0 9IN  GS21 (SUTURE) ×2
SUT VLOC 180 0 9IN GS21 (SUTURE) ×2 IMPLANT
SYR 50ML LL SCALE MARK (SYRINGE) ×4 IMPLANT
SYR BULB IRRIGATION 50ML (SYRINGE) ×4 IMPLANT
SYRINGE 10CC LL (SYRINGE) ×4 IMPLANT
TOWEL OR 17X24 6PK STRL BLUE (TOWEL DISPOSABLE) ×12 IMPLANT
TRAY FOLEY CATH SILVER 14FR (SET/KITS/TRAYS/PACK) ×4 IMPLANT
TRENDGUARD 450 HYBRID PRO PACK (MISCELLANEOUS) ×3
TRENDGUARD 600 HYBRID PROC PK (MISCELLANEOUS)
TROCAR PORT AIRSEAL 5X120 (TROCAR) IMPLANT
TROCAR PORT AIRSEAL 8X120 (TROCAR) ×4 IMPLANT
TROCAR XCEL NON-BLD 11X100MML (ENDOMECHANICALS) ×4 IMPLANT
TROCAR XCEL NON-BLD 5MMX100MML (ENDOMECHANICALS) ×4 IMPLANT
WARMER LAPAROSCOPE (MISCELLANEOUS) IMPLANT

## 2016-10-09 NOTE — Anesthesia Procedure Notes (Signed)
Procedure Name: Intubation Date/Time: 10/09/2016 12:18 PM Performed by: Riki Sheer Pre-anesthesia Checklist: Patient identified, Emergency Drugs available, Suction available, Patient being monitored and Timeout performed Patient Re-evaluated:Patient Re-evaluated prior to inductionOxygen Delivery Method: Circle system utilized Preoxygenation: Pre-oxygenation with 100% oxygen Intubation Type: IV induction Ventilation: Mask ventilation without difficulty Laryngoscope Size: Miller and 2 Grade View: Grade I Tube type: Oral Tube size: 7.0 mm Number of attempts: 1 Airway Equipment and Method: Stylet Placement Confirmation: ETT inserted through vocal cords under direct vision,  positive ETCO2,  CO2 detector and breath sounds checked- equal and bilateral Secured at: 20 cm Tube secured with: Tape Dental Injury: Teeth and Oropharynx as per pre-operative assessment

## 2016-10-09 NOTE — H&P (Addendum)
Laurie Hutchinson is an 36 y.o. female  G5P3 presents for her  scheduled LAVH / possible TLH bilateral salpingectomy / Cystoscopy 3/28 for persistent abnormal uterine bleeding, uterine fibroids and dysmenorrhea.  She tried conservative management with OCPs and Depo Provera in the past.  She notes normal urinary and defecatory function.  Her last pap smear was 02/16/16 - negative for intraepithelial lesion HR HPV pos  Pertinent Gynecological History: Menses: flow is excessive with use of 4-7 pads or tampons on heaviest days Bleeding: dysfunctional uterine bleeding Contraception: tubal ligation DES exposure: denies Blood transfusions: none Sexually transmitted diseases: HPV Previous GYN Procedures: BTL / LEEP  Last mammogram: n/a  Last pap: normal Date: 02/16/2016 NML with HR HPV pos OB History: G5, P3   Menstrual History: Menarche age: 79 Patient's last menstrual period was 10/04/2016 (exact date).    Past Medical History:  Diagnosis Date  . ADD (attention deficit disorder)   . Anemia   . Cancer (HCC)    cervical cancer  . Deviated nasal septum   . GERD (gastroesophageal reflux disease)   . Heart murmur   . Heart palpitations   . Hypertension    with pregnancy  . Hypoglycemia   . Migraine headache   . Panic attack   . Pneumonia   . Rheumatic fever     Past Surgical History:  Procedure Laterality Date  . COLPOSCOPY    . DILATION AND CURETTAGE OF UTERUS    . NASAL SEPTUM SURGERY    . TUBAL LIGATION      History reviewed. No pertinent family history.  Social History:  reports that she has been smoking Cigarettes.  She has a 20.00 pack-year smoking history. She has never used smokeless tobacco. She reports that she drinks alcohol. She reports that she does not use drugs.  Allergies:  Allergies  Allergen Reactions  . Other Anaphylaxis and Rash    Not sure of drug name but it was given to her during surgery in 2010  . Erythromycin     Abdominal cramping  .  Neosporin [Neomycin-Bacitracin Zn-Polymyx] Itching    Prescriptions Prior to Admission  Medication Sig Dispense Refill Last Dose  . acetaminophen (TYLENOL) 500 MG tablet Take 1,000 mg by mouth every 6 (six) hours as needed for mild pain or moderate pain.   Past Month at Unknown time  . aspirin-acetaminophen-caffeine (EXCEDRIN MIGRAINE) 250-250-65 MG tablet Take 2 tablets by mouth daily as needed for migraine.   Past Month at Unknown time  . fluticasone (FLONASE) 50 MCG/ACT nasal spray Place 2 sprays into both nostrils daily.   Past Month at Unknown time  . ibuprofen (ADVIL,MOTRIN) 200 MG tablet Take 400 mg by mouth every 4 (four) hours as needed for mild pain or moderate pain.    10/08/2016 at Unknown time  . Multiple Vitamins-Minerals (ADULT GUMMY PO) Take 2 tablets by mouth daily. With iron   10/08/2016 at Unknown time  . naproxen sodium (ANAPROX) 220 MG tablet Take 440 mg by mouth 2 (two) times daily as needed (pain).   10/08/2016 at Unknown time  . cephALEXin (KEFLEX) 500 MG capsule Take 1 capsule (500 mg total) by mouth 4 (four) times daily. (Patient not taking: Reported on 09/26/2016) 28 capsule 0 Not Taking at Unknown time  . clindamycin (CLEOCIN) 150 MG capsule Take 1 capsule (150 mg total) by mouth every 6 (six) hours. (Patient not taking: Reported on 09/26/2016) 28 capsule 0 Not Taking at Unknown time  . diclofenac (VOLTAREN) 75  MG EC tablet Take 1 tablet (75 mg total) by mouth 2 (two) times daily. (Patient not taking: Reported on 09/26/2016) 12 tablet 0 Not Taking at Unknown time  . HYDROcodone-acetaminophen (NORCO/VICODIN) 5-325 MG per tablet Take 1 tablet by mouth every 4 (four) hours as needed. (Patient not taking: Reported on 09/26/2016) 15 tablet 0 Not Taking at Unknown time    ROS as per HPI  Blood pressure 111/75, pulse 81, temperature 97.4 F (36.3 C), temperature source Oral, resp. rate 20, height 5\' 3"  (1.6 m), weight 88.9 kg (196 lb), last menstrual period 10/04/2016, SpO2 98  %. Physical Exam AVVS GEN: Patient AAOx 3 NAD ABD: Soft NT, ND,  GU: Deferred  Results for orders placed or performed during the hospital encounter of 10/09/16 (from the past 24 hour(s))  Pregnancy, urine     Status: None   Collection Time: 10/09/16 10:30 AM  Result Value Ref Range   Preg Test, Ur NEGATIVE NEGATIVE    No results found.  Assessment/Plan: 36 yo with symptomatic fibroid uterus / chronic pelvic pain, recurrent ovarian cysts  Patient on call to OR TLH / BSO/ Cystoscopy  In the office we discussed ovarian preservation vs. Removal of her ovaries. We discussed the 1/70 lifetime risk of ovarian cancer (her risk likely higher due to her mother having ovarian cancer) and 5-10% risk for need of future surgery for ovarian pathology.With careful consideration and despite her age the patient has decided to have her ovaries removed as her mother had early onset ovarian cancer and she fears having to return for future surgery for ovarian pathology / pain etc.   The patient was counseled for more than an hour in the office about the risks of removal of the ovaries at her age to include weakening her cardiovascular health, increasing her risk for osteoporosis, the need for post operative hormone replacement therapy.  She states that she has quit smoking now.  We had discussed the WHI results of Caucasian women on long term HRT.    The R/B of surgery were discussed w/ the patient to include, but not limited to bleeding/transfusion, infection, wound breakdown/poor healing, damage to organs in abdomen and pelvis w/ possible need for further surgical repair, need for abdominal incision to complete procedure, blood clot, PE/MI/stroke. Additionally,  Pt understands these risks and consents to surgery.  She has a family h/o ovarian cancer (her mother). So she is concerned about keeping her ovaries. She is considering removal. She was counseled that at her young age she would need HRT and that would be  difficult to proceed with as she is a smoker. We also discussed the WHI findings with long term HRT.    Kaleeah Gingerich, [place] 10/09/2016, 11:57 AM

## 2016-10-09 NOTE — Transfer of Care (Signed)
Immediate Anesthesia Transfer of Care Note  Patient: Laurie Hutchinson  Procedure(s) Performed: Procedure(s): HYSTERECTOMY TOTAL LAPAROSCOPIC WITH SALPINGECTOMY (N/A) CYSTOSCOPY (N/A)  Patient Location: PACU  Anesthesia Type:General  Level of Consciousness: sedated  Airway & Oxygen Therapy: Patient Spontanous Breathing and Patient connected to nasal cannula oxygen  Post-op Assessment: Report given to RN and Post -op Vital signs reviewed and stable  Post vital signs: Reviewed and stable  Last Vitals:  Vitals:   10/09/16 1042  BP: 111/75  Pulse: 81  Resp: 20  Temp: 36.3 C    Last Pain:  Vitals:   10/09/16 1042  TempSrc: Oral      Patients Stated Pain Goal: 3 (51/83/43 7357)  Complications: No apparent anesthesia complications

## 2016-10-09 NOTE — Anesthesia Postprocedure Evaluation (Signed)
Anesthesia Post Note  Patient: Laurie Hutchinson  Procedure(s) Performed: Procedure(s) (LRB): HYSTERECTOMY TOTAL LAPAROSCOPIC WITH SALPINGECTOMY (Bilateral) CYSTOSCOPY (N/A)  Patient location during evaluation: PACU Anesthesia Type: General Level of consciousness: sedated Pain management: pain level controlled Vital Signs Assessment: post-procedure vital signs reviewed and stable Respiratory status: spontaneous breathing and respiratory function stable Cardiovascular status: stable Anesthetic complications: no        Last Vitals:  Vitals:   10/09/16 1600 10/09/16 1615  BP: 113/82 104/61  Pulse: (!) 102 87  Resp: 12 (!) 9  Temp:      Last Pain:  Vitals:   10/09/16 1630  TempSrc:   PainSc: 8    Pain Goal: Patients Stated Pain Goal: 3 (10/09/16 1630)               Navika Hoopes DANIEL

## 2016-10-09 NOTE — Anesthesia Preprocedure Evaluation (Signed)
Anesthesia Evaluation  Patient identified by MRN, date of birth, ID band Patient awake    Reviewed: Allergy & Precautions, NPO status , Patient's Chart, lab work & pertinent test results  History of Anesthesia Complications (+) history of anesthetic complications (unclear history of "heart stopping" during tubal ligation. records unavailible)  Airway Mallampati: II  TM Distance: >3 FB Neck ROM: Full    Dental no notable dental hx.    Pulmonary neg pulmonary ROS, Current Smoker,    Pulmonary exam normal breath sounds clear to auscultation       Cardiovascular hypertension, negative cardio ROS Normal cardiovascular exam Rhythm:Regular Rate:Normal     Neuro/Psych negative neurological ROS  negative psych ROS   GI/Hepatic negative GI ROS, Neg liver ROS,   Endo/Other  negative endocrine ROS  Renal/GU negative Renal ROS  negative genitourinary   Musculoskeletal negative musculoskeletal ROS (+)   Abdominal   Peds negative pediatric ROS (+)  Hematology negative hematology ROS (+)   Anesthesia Other Findings   Reproductive/Obstetrics negative OB ROS                            Anesthesia Physical Anesthesia Plan  ASA: II  Anesthesia Plan: General   Post-op Pain Management:    Induction: Intravenous  Airway Management Planned: Oral ETT  Additional Equipment:   Intra-op Plan:   Post-operative Plan: Extubation in OR  Informed Consent: I have reviewed the patients History and Physical, chart, labs and discussed the procedure including the risks, benefits and alternatives for the proposed anesthesia with the patient or authorized representative who has indicated his/her understanding and acceptance.   Dental advisory given  Plan Discussed with: CRNA  Anesthesia Plan Comments:         Anesthesia Quick Evaluation

## 2016-10-10 ENCOUNTER — Encounter (HOSPITAL_COMMUNITY): Payer: Self-pay | Admitting: Obstetrics & Gynecology

## 2016-10-10 DIAGNOSIS — D259 Leiomyoma of uterus, unspecified: Secondary | ICD-10-CM | POA: Diagnosis not present

## 2016-10-10 MED ORDER — IBUPROFEN 800 MG PO TABS: 800.0000 mg | ORAL_TABLET | Freq: Three times a day (TID) | ORAL | 3 refills | Status: DC | PRN

## 2016-10-10 MED ORDER — INFLUENZA VAC SPLIT QUAD 0.5 ML IM SUSY: 0.5000 mL | PREFILLED_SYRINGE | INTRAMUSCULAR | Status: DC

## 2016-10-10 MED ORDER — ESTRADIOL 0.05 MG/24HR TD PTWK: 0.0500 mg | MEDICATED_PATCH | TRANSDERMAL | 12 refills | Status: DC

## 2016-10-10 MED ORDER — INFLUENZA VAC SPLIT QUAD 0.5 ML IM SUSY
0.5000 mL | PREFILLED_SYRINGE | Freq: Once | INTRAMUSCULAR | Status: AC
  Administered 2016-10-10: 0.5 mL via INTRAMUSCULAR

## 2016-10-10 MED ORDER — OXYCODONE-ACETAMINOPHEN 5-325 MG PO TABS: 1.0000 | ORAL_TABLET | ORAL | 0 refills | Status: DC | PRN

## 2016-10-10 NOTE — Addendum Note (Signed)
Addendum  created 10/10/16 8563 by Flossie Dibble, CRNA   Sign clinical note

## 2016-10-10 NOTE — Progress Notes (Signed)
1 Day Post-Op Procedure(s) (LRB): HYSTERECTOMY TOTAL LAPAROSCOPIC WITH SALPINGECTOMY (Bilateral) CYSTOSCOPY (N/A)  Subjective: Patient reports incisional pain, tolerating PO, + flatus and no problems voiding.    Objective: I have reviewed patient's vital signs, intake and output and medications.  General: alert, cooperative and no distress GI: soft, non-tender; bowel sounds normal; no masses,  no organomegaly and incision: clean, dry and intact Extremities: extremities normal, atraumatic, no cyanosis or edema, Homans sign is negative, no sign of DVT and no edema, redness or tenderness in the calves or thighs Vaginal Bleeding: none  Assessment: s/p Procedure(s): HYSTERECTOMY TOTAL LAPAROSCOPIC WITH SALPINGECTOMY (Bilateral) CYSTOSCOPY (N/A): progressing well  Plan: Discharge home  LOS: 0 days    [place], [place] 10/10/2016, 9:30 AM

## 2016-10-10 NOTE — Discharge Summary (Signed)
Physician Discharge Summary  Patient ID: Laurie Hutchinson MRN: [id number] DOB/AGE: 01-30-1981 36 y.o.  Admit date: 10/09/2016 Discharge date: 10/10/2016  Admission Diagnoses: Chronic pelvic pain, abnormal uterine bleeding  Discharge Diagnoses:  Active Problems:   Status post laparoscopic hysterectomy   Discharged Condition: good  Hospital Course: Patient taken to operating room where below procedures were performed.  There were no intraoperative or post operative complications.  Patient progressed well in the hospital and on POD#1 was meeting all discharge criteria and was discharged home.   Consults: None   Treatments: IV hydration, antibiotics: Ancef, analgesia: Vicodin and surgery: TLH / BSO / cystoscopy  Discharge Exam: Blood pressure (!) 108/58, pulse 72, temperature 98.6 F (37 C), temperature source Oral, resp. rate 17, height 5\' 3"  (1.6 m), weight 88.9 kg (196 lb), last menstrual period 10/04/2016, SpO2 99 %.  General: alert, cooperative and no distress GI: soft, non-tender; bowel sounds normal; no masses,  no organomegaly and incision: clean, dry and intact Extremities: extremities normal, atraumatic, no cyanosis or edema, Homans sign is negative, no sign of DVT and no edema, redness or tenderness in the calves or thighs Vaginal Bleeding: none  Disposition: 01-Home or Self Care  Discharge Instructions    Call MD for:    Complete by:  As directed    Abnormal foul smelling vaginal discharge or heavy vaginal bleeding   Call MD for:  difficulty breathing, headache or visual disturbances    Complete by:  As directed    Call MD for:  extreme fatigue    Complete by:  As directed    Call MD for:  hives    Complete by:  As directed    Call MD for:  persistant dizziness or light-headedness    Complete by:  As directed    Call MD for:  persistant nausea and vomiting    Complete by:  As directed    Call MD for:  redness, tenderness, or signs of infection (pain,  swelling, redness, odor or green/yellow discharge around incision site)    Complete by:  As directed    Call MD for:  severe uncontrolled pain    Complete by:  As directed    Call MD for:  temperature >100.4    Complete by:  As directed    Diet - low sodium heart healthy    Complete by:  As directed    Discharge instructions    Complete by:  As directed    No heavy lifting greater than 20 lbs.  No driving while taking Percocet. Don't take any other medications containing tylenol while taking the Percocet.  Take Motrin on full stomach with a lot of water Nothing in vagina for 6 weeks Call office [phone] for your post operative visits (incision check in one week and another in 4 weeks from date of surgery), and if you have any questions or concerns.  If you have an emergency you can contact Dr. Alwyn Pea directly [phone]   Discharge wound care:    Complete by:  As directed    Your incisions have skin glue which will dissolve over time.  You may shower, no baths. Don't use lotions or savs on incision sites.   Driving Restrictions    Complete by:  As directed    No driving for 10 days or while taking percocet   Increase activity slowly    Complete by:  As directed    Lifting restrictions    Complete by:  As directed  No heavy lifting, nothing heavier than a gallon a milk or approximately 20 pounds   Remove dressing in 48 hours    Complete by:  As directed    Sexual Activity Restrictions    Complete by:  As directed    No intercourse or anything in vagina for 4-6 weeks or until seen at post operative visit.     Allergies as of 10/10/2016      Reactions   Other Anaphylaxis, Rash   Not sure of drug name but it was given to her during surgery in 2010   Erythromycin    Abdominal cramping   Neosporin [neomycin-bacitracin Zn-polymyx] Itching      Medication List    STOP taking these medications   acetaminophen 500 MG tablet Commonly known as:  TYLENOL    aspirin-acetaminophen-caffeine 250-250-65 MG tablet Commonly known as:  EXCEDRIN MIGRAINE   cephALEXin 500 MG capsule Commonly known as:  KEFLEX   clindamycin 150 MG capsule Commonly known as:  CLEOCIN   diclofenac 75 MG EC tablet Commonly known as:  VOLTAREN   HYDROcodone-acetaminophen 5-325 MG tablet Commonly known as:  NORCO/VICODIN   naproxen sodium 220 MG tablet Commonly known as:  ANAPROX     TAKE these medications   ADULT GUMMY PO Take 2 tablets by mouth daily. With iron   estradiol 0.05 mg/24hr patch Commonly known as:  CLIMARA - Dosed in mg/24 hr Place 1 patch (0.05 mg total) onto the skin once a week. Start taking on:  10/16/2016   fluticasone 50 MCG/ACT nasal spray Commonly known as:  FLONASE Place 2 sprays into both nostrils daily.   ibuprofen 200 MG tablet Commonly known as:  ADVIL,MOTRIN Take 400 mg by mouth every 4 (four) hours as needed for mild pain or moderate pain. What changed:  Another medication with the same name was added. Make sure you understand how and when to take each.   ibuprofen 800 MG tablet Commonly known as:  ADVIL,MOTRIN Take 1 tablet (800 mg total) by mouth every 8 (eight) hours as needed (mild pain). What changed:  You were already taking a medication with the same name, and this prescription was added. Make sure you understand how and when to take each.   oxyCODONE-acetaminophen 5-325 MG tablet Commonly known as:  PERCOCET/ROXICET Take 1-2 tablets by mouth every 4 (four) hours as needed (moderate to severe pain (when tolerating fluids)).        SignedCaffie Damme 10/10/2016, 9:40 AM

## 2016-10-10 NOTE — Anesthesia Postprocedure Evaluation (Addendum)
Anesthesia Post Note  Patient: Laurie Hutchinson  Procedure(s) Performed: Procedure(s) (LRB): HYSTERECTOMY TOTAL LAPAROSCOPIC WITH SALPINGECTOMY (Bilateral) CYSTOSCOPY (N/A)  Patient location during evaluation: [facility] Anesthesia Type: General Level of consciousness: awake and alert Pain management: satisfactory to patient Vital Signs Assessment: post-procedure vital signs reviewed and stable Respiratory status: spontaneous breathing and respiratory function stable Cardiovascular status: stable Postop Assessment: adequate PO intake Anesthetic complications: no Comments: The patient commented that her tongue felt a little swollen on the right side towards the front and in that area it was numb. I examined the area and noticed a little red spot and mild swelling. I discussed that we often insert a folded piece of soft gauze in the mouth before emergence to prevent patients from biting their tongues or tubes and she may have inadvertently bit her tongue waking up. She was reassured that the swelling would go down and that we would be available to discuss this further should she have any concerns.         Last Vitals:  Vitals:   10/10/16 0100 10/10/16 0334  BP: 98/72 119/69  Pulse: 87 60  Resp: 18 18  Temp: 36.8 C 36.7 C    Last Pain:  Vitals:   10/10/16 0633  TempSrc:   PainSc: Asleep   Pain Goal: Patients Stated Pain Goal: 4 (10/10/16 0346)               Katherina Mires

## 2016-10-28 ENCOUNTER — Telehealth: Payer: Self-pay | Admitting: *Deleted

## 2016-10-28 NOTE — Telephone Encounter (Signed)
Spoke to patient - her new patient appt has been canceled on 10/29/16 with Dr. Felecia Shelling.  He is unavailable until 11/18/16.  The patient is unable to wait this long and is requesting an appt with another physician.  Please call to reschedule.

## 2016-10-29 ENCOUNTER — Ambulatory Visit: Payer: 59 | Admitting: Neurology

## 2016-10-30 ENCOUNTER — Ambulatory Visit (INDEPENDENT_AMBULATORY_CARE_PROVIDER_SITE_OTHER): Payer: 59 | Admitting: Neurology

## 2016-10-30 ENCOUNTER — Encounter: Payer: Self-pay | Admitting: Neurology

## 2016-10-30 ENCOUNTER — Encounter (INDEPENDENT_AMBULATORY_CARE_PROVIDER_SITE_OTHER): Payer: Self-pay

## 2016-10-30 VITALS — BP 131/84 | HR 73 | Ht 63.0 in | Wt 201.0 lb

## 2016-10-30 DIAGNOSIS — Z90710 Acquired absence of both cervix and uterus: Secondary | ICD-10-CM | POA: Diagnosis not present

## 2016-10-30 DIAGNOSIS — R202 Paresthesia of skin: Secondary | ICD-10-CM

## 2016-10-30 MED ORDER — DULOXETINE HCL 60 MG PO CPEP: 60.0000 mg | ORAL_CAPSULE | Freq: Every day | ORAL | 11 refills | Status: DC

## 2016-10-30 MED ORDER — GABAPENTIN 100 MG PO CAPS: 300.0000 mg | ORAL_CAPSULE | Freq: Three times a day (TID) | ORAL | 11 refills | Status: DC

## 2016-10-30 NOTE — Progress Notes (Signed)
PATIENT: Laurie Hutchinson DOB: [birth year]  Chief Complaint  Patient presents with  . Leg Pain    Reports left leg pain, numbness and tingling since having a hysterectomy on 10/09/16.  She has been prescribed gabapentin 300mg , TID but is only able to tolerate 300mg  at bedtime with limited relief.  The daytime doses caused excessive drowsiness.  . OB-GYN    Sanjuana Kava, MD     HISTORICAL  Laurie Hutchinson is a 36 year old right-handed female, seen in refer by her gynecologist  Sanjuana Kava, MD for evaluation of left leg numbness and pain since her hysterectomy on March 20 eighth 2018, initial evaluation was on October 30 2016.  I reviewed and summarized the surgical record on October 10 2016, laparoscopic surgery hysterectomy, with salpingectomy bilaterally,   She underwent surgery via general anesthesia, as soon as she woke up, she noticed left leg numbness involving her whole left leg, also itching sensation at left anterior thigh, she wants to scratch her left thigh, the paresthesia has been persistent since then, she described it as if her left leg is waking up from Novocain shots, tingling, sometimes painful sensation, altered sensation with light touch at the left anterior, lateral thigh, extending to left medial leg, involving whole left foot, also stretchy sensation around her left knee with prolonged sitting,  She complains of worsening left leg pain with ambulating, is so frustrated about her symptoms, she is tearful, she was given prescription of gabapentin 300 mg 3 times a day, which help her sleep for 3-4 hours each day, cause drowsiness during daytime, overall her symptoms have mild improvement, she no longer has severe left foot pain burning sensation, but still had persistent left foot paresthesia involving left top and bottom foot.  Pathology  of her total hysterectomy are consistent of with chronic inflammation, leiomyoma,  Laboratory evaluation showed normal CBC,  hemoglobin of 13 point 8,  She denies significant low back pain, she continue has mild low pelvic pain, more on the left side, require oxycodone 5/325 mg every 6 hours,  REVIEW OF SYSTEMS: Full 14 system review of systems performed and notable only for fatigue, cough, snoring, increased thirst, joint pain, achy muscles  ALLERGIES: Allergies  Allergen Reactions  . Other Anaphylaxis and Rash    Not sure of drug name but it was given to her during surgery in 2010  . Erythromycin     Abdominal cramping  . Neosporin [Neomycin-Bacitracin Zn-Polymyx] Itching    HOME MEDICATIONS: Current Outpatient Prescriptions  Medication Sig Dispense Refill  . cephALEXin (KEFLEX) 500 MG capsule Take 500 mg by mouth 4 (four) times daily. X 10 days    . estradiol (VIVELLE-DOT) 0.1 MG/24HR patch Place 1 patch onto the skin once a week.    . gabapentin (NEURONTIN) 300 MG capsule Take 300 mg by mouth 3 (three) times daily.    Marland Kitchen ibuprofen (ADVIL,MOTRIN) 800 MG tablet Take 1 tablet (800 mg total) by mouth every 8 (eight) hours as needed (mild pain). 60 tablet 3  . linaclotide (LINZESS) 145 MCG CAPS capsule Take 145 mcg by mouth daily.    . Multiple Vitamins-Minerals (ADULT GUMMY PO) Take 2 tablets by mouth daily. With iron    . nystatin (MYCOSTATIN/NYSTOP) powder Apply topically as needed.    Marland Kitchen oxyCODONE-acetaminophen (PERCOCET/ROXICET) 5-325 MG tablet Take 1-2 tablets by mouth every 4 (four) hours as needed (moderate to severe pain (when tolerating fluids)). 45 tablet 0   No current facility-administered medications for this visit.  PAST MEDICAL HISTORY: Past Medical History:  Diagnosis Date  . ADD (attention deficit disorder)   . Anemia   . Cancer (HCC)    cervical cancer  . Deviated nasal septum   . GERD (gastroesophageal reflux disease)   . Heart murmur   . Heart palpitations   . Hypertension    with pregnancy  . Hypoglycemia   . Left leg pain   . Migraine headache   . Panic attack   .  Pneumonia   . Rheumatic fever     PAST SURGICAL HISTORY: Past Surgical History:  Procedure Laterality Date  . COLPOSCOPY    . CYSTOSCOPY N/A 10/09/2016   Procedure: CYSTOSCOPY;  Surgeon: Sanjuana Kava, MD;  Location: [facility];  Service: Gynecology;  Laterality: N/A;  . DILATION AND CURETTAGE OF UTERUS    . NASAL SEPTUM SURGERY    . TOTAL LAPAROSCOPIC HYSTERECTOMY WITH SALPINGECTOMY Bilateral 10/09/2016   Procedure: HYSTERECTOMY TOTAL LAPAROSCOPIC WITH SALPINGECTOMY;  Surgeon: Sanjuana Kava, MD;  Location: [facility];  Service: Gynecology;  Laterality: Bilateral;  . TUBAL LIGATION      FAMILY HISTORY: Family History  Problem Relation Age of Onset  . Hypertension Mother   . Thyroid disease Mother   . Heart disease Father   . Heart attack Maternal Grandmother   . Heart attack Maternal Grandfather   . Heart attack Paternal Grandmother   . Heart attack Paternal Grandfather     SOCIAL HISTORY:  Social History   Social History  . Marital status: Single    Spouse name: N/A  . Number of children: 3  . Years of education: Associates   Occupational History  . Property Manager    Social History Main Topics  . Smoking status: Former Smoker    Packs/day: 1.00    Years: 20.00    Types: Cigarettes  . Smokeless tobacco: Never Used     Comment: Quit 10/09/16  . Alcohol use No     Comment: Not used since 2008  . Drug use: No  . Sexual activity: Yes    Birth control/ protection: None   Other Topics Concern  . Not on file   Social History Narrative   Lives at home with daughter.   Right-handed.   2-3 cups caffeine per day.     PHYSICAL EXAM   Vitals:   10/30/16 1443  BP: 131/84  Pulse: 73  Weight: 201 lb (91.2 kg)  Height: 5\' 3"  (1.6 m)    Not recorded      Body mass index is 35.61 kg/m.  PHYSICAL EXAMNIATION:  Gen: NAD, conversant, well nourised, obese, well groomed                     Cardiovascular: Regular rate rhythm, no peripheral edema, warm, nontender. Eyes:  Conjunctivae clear without exudates or hemorrhage Neck: Supple, no carotid bruits. Pulmonary: Clear to auscultation bilaterally   NEUROLOGICAL EXAM:  MENTAL STATUS: Speech:    Speech is normal; fluent and spontaneous with normal comprehension.  Cognition:     Orientation to time, place and person     Normal recent and remote memory     Normal Attention span and concentration     Normal Language, naming, repeating,spontaneous speech     Fund of knowledge   CRANIAL NERVES: CN II: Visual fields are full to confrontation. Fundoscopic exam is normal with sharp discs and no vascular changes. Pupils are round equal and briskly reactive to light. CN III, IV, VI: extraocular  movement are normal. No ptosis. CN V: Facial sensation is intact to pinprick in all 3 divisions bilaterally. Corneal responses are intact.  CN VII: Face is symmetric with normal eye closure and smile. CN VIII: Hearing is normal to rubbing fingers CN IX, X: Palate elevates symmetrically. Phonation is normal. CN XI: Head turning and shoulder shrug are intact CN XII: Tongue is midline with normal movements and no atrophy.  MOTOR: There is no pronator drift of out-stretched arms. Muscle bulk and tone are normal. Muscle strength is normal.  REFLEXES: Reflexes are 2+ and symmetric at the biceps, triceps, knees, and ankles. Plantar responses are flexor.  SENSORY:  Decreased light touch pinprick at left anterior thigh and, lateral thigh, extending to left anterior medial leg, top and heel of left foot.  COORDINATION: Rapid alternating movements and fine finger movements are intact. There is no dysmetria on finger-to-nose and heel-knee-shin.    GAIT/STANCE: Mildly antalgic.   DIAGNOSTIC DATA (LABS, IMAGING, TESTING) - I reviewed patient records, labs, notes, testing and imaging myself where available.   ASSESSMENT AND PLAN  Laurie Hutchinson is a 36 y.o. female   left leg paresthesia  In the distribution of  left femoral, left saphenous, lateral femoral cutaneous nerve, left common peroneal, distal tibial nerve territory.  Possibility including left lumbar sacral plexopathy,  Proceed with EMG nerve conduction study  MRI of pelvic with without contrast  Cymbalta 60 mg every morning, gabapentin up to 600 mg every night, gabapentin 100 mg 2-3 times a day during the daytime   Marcial Pacas, M.D. Ph.D.  [facility] [address] [place], [place] [address] Ph: [phone] Fax: [phone]  CC: Sanjuana Kava, MD

## 2016-10-31 ENCOUNTER — Telehealth: Payer: Self-pay | Admitting: Neurology

## 2016-10-31 NOTE — Telephone Encounter (Signed)
Noted, thank you

## 2016-10-31 NOTE — Telephone Encounter (Signed)
[employer] did not approve the MRI.Marland Kitchen The phone number for the peer to peer is [phone] select option 3. The case number is [id number]. Thank you for your help.

## 2016-10-31 NOTE — Telephone Encounter (Signed)
I have done MRI pelvic w/wo contrast: [id number], good for 45 days.

## 2016-11-04 ENCOUNTER — Encounter: Payer: Self-pay | Admitting: *Deleted

## 2016-11-04 ENCOUNTER — Other Ambulatory Visit: Payer: Self-pay | Admitting: *Deleted

## 2016-11-04 ENCOUNTER — Telehealth: Payer: Self-pay | Admitting: Neurology

## 2016-11-04 MED ORDER — DULOXETINE HCL 20 MG PO CPEP: 20.0000 mg | ORAL_CAPSULE | Freq: Every day | ORAL | 5 refills | Status: DC

## 2016-11-04 NOTE — Addendum Note (Signed)
Addended by: Noberto Retort C on: 11/04/2016 07:15 PM   Modules accepted: Orders

## 2016-11-04 NOTE — Telephone Encounter (Signed)
Patient called office in reference to DULoxetine (CYMBALTA) 60 MG capsule.  Patient states it has made her throw up after taking it for the first time yesterday.  gabapentin (NEURONTIN) 100 MG capsule patient states she feels drunk after taking this medication during the day making her feel loopy.  Patient states she fell down her stairs on Saturday and Friday fell in her kitchen.  Please call

## 2016-11-04 NOTE — Telephone Encounter (Signed)
[name]  She is vomiting with duloxetine 60mg .  Dr. Krista Blue has reviewed her chart - she would like her to continue gabapentin 300mg , qhs and try duloxetine 20mg , daily.  Patient is agreeable to this plan and a new prescription for the lower dose of duloxetine has been sent to the pharmacy.

## 2016-11-12 ENCOUNTER — Inpatient Hospital Stay: Admission: RE | Admit: 2016-11-12 | Payer: 59 | Source: Ambulatory Visit

## 2016-11-12 NOTE — Op Note (Signed)
OPERATIVE NOTE  Laurie Hutchinson, Laurie Hutchinson  DOB: 04/26/1981  MRN:   Date of Surgery: 10/09/16   Preoperative Diagnosis: Severe Dysmenorrhea Abnormal uterine bleeding Uterine fibroids  Postoperative Diagnosis: Same as above  Procedure: Total Laparoscopic hysterectomy Bilateral salpingoopherectomy Cystoscopy  Surgeon: Mady Haagensen. Alwyn Pea, M.D.  Assistant: Bobbye Charleston, M.D.   Anesthetic: General endotracheal  Indication: 36 y.o. female  G35P3 with persistent abnormal uterine bleeding, uterine fibroids and dysmenorrhea.  She tried conservative management with OCPs and Depo Provera in the past.   Findings: Exam under anesthesia:  External vulva: normal vaginal vault: normal ruggae cervix: normal uterus 12 weeks size mobile anteverted adnexa Laparoscopy:  Boggy appearing uterus, Adnexa: normal appearing ovaries and fallopian tubes bilaterally.   Procedure: The patient was brought into the operating room with an intravenous line in placed, and anesthetic was administered. She was placed in a low anterior lithotomy position using Allen stirrups. The vaginal portion of the procedure included placement of a RUMI uterine manipulator with a Koh colpotomy ring and a vaginal occluder balloon.  The laparoscopic port sites were anesthetized with intradermal injection of 0.25% Marcaine. There were 3 ports placed, a 35-KK umbilical port,and 5-mm right and left lower quadrant ports. A 1cm subumbilical incision was made with a 11  blade scalpel and the 63mm 0 degree laparoscope attached to the Excel visiport was used to enter the abdomen directly under direct video visualization. The abdominal entry was confirmed with the laparoscope and pneumoperitoneum was insufflated without difficulty. The patient was placed in steep Trendelenburg.  The 5-mm accessory ports was then placed in the left and right lower quadrants under direct laparoscopic guidance . There was no noted injury with placement of any of the  trocars.   The ureter was visualized coursing well under the operative site. The right infundibulopelvic ligament was divided using 11mm Ligasure. The right round ligament was then cauterized and transected via the Ligasure. The anterior broad ligament was then isolated and the the mesovarium was skeletonized. A bladder flap was mobilized by the endoshears attached to monopolar cautery and the peritoneum on the vesicouterine fold was incised to mobilize the bladder. The above was repeated for the contralateral side. There was hemostasis noted at the IP and round ligament pedicles. Once the [facility] colpotomy ring was skeletonized and in position, the uterine arteries were sealed and transected using the Ligasure at the level of the colpotomy ring. The vagina was transected using the monopolar endoshears resulting in separation of the uterus and attached tubes and ovaries. The uterus, tubes, and ovaries were then delivered through the vagina. The vaginal vault was closed vaginally. A modified McCalls colposuspension was performed and the vagina was closed with interrupted suture of 0-vicryl.    A cystoscopy was then performed using a 70 degree cystoscopy and a complete bladder survey was performed. There was no noted injury to the bladder urothelium and there were brisk jets from both ureteral orifices.   Surgeons gowns and gloves were changed and attention was returned to the abdomen.  Pneumoperitoneum was obtained again and the pelvis was examined and excellent hemostasis noted. The abdomen and pelvis was irrigated thoroughly.  The insufflation pressure was reduced, and no evidence of bleeding was seen. The accessory ports were removed under direct laparoscopic guidance and  the pneumoperitoneum was released. The umbilical port was removed using laparoscopic guidance. The umbilical fascia was closed with an interrupted figure-of-eight stitch using 2-0 Vicryl. The skin was closed with subcuticular stitches using  4-0 Monocryl suture. Dermabond  was used to cover each incision site and op sites placed.  The final sponge, needle, and instrument counts were correct at the completion of the procedure. The patient tolerated the procedure well and was awakened and taken to the post anesthesia care unit in stable condition.   Caffie Damme        The patient was taken to the operating room where a general anesthetic was given. A time out was performed confirming the patient's name and procedure. An examination under anesthesia was performed. The patient's abdomen was prepped with ChloraPrep. The perineum and vagina were prepped with multiple layers of Betadine. A Grave's speculum was placed in the vagina and the anterior lip of the cervix was held with a single tooth tenaculum, uterus was sounded to 9cm  and dilated with Kennon Rounds dilators. The tenaculum was removed and the anterior lip of the cervix held with a stitch of 0-vicryl.The 8 cm Rumi and 3 cm Koh ring were assembled and placed in proper fashion.  The  Speculum was removed and the bladder catheterized with a foley.  The patient was sterilely draped. The subumbilical area was injected with half percent Marcaine. An incision was made in the umbilicus with 11 blade scalpel after infiltration with 0.25% Marcaine. A 39mm Excel Visiport trocar was used with l71mm 0 degree laparoscope attached to perform direct entry into the patient's abdomen. Direct video visualization confirmed entry and pneumoperitoneum was obtained using approximately 3L CO2 gas. The patient was placed in steep Trendelenburg position. A small incision was made and a 5 mm trocar was inserted into the abdominal cavity along the right and left pelvis under direct visualization. There was no injury noted with placement of trocars. The pelvic contents were visualized and findings noted above, both ureters were identified crossing the pelvic brim and pelvic sidewall coursing well below operative  field. Pictures were taken of the patient's pelvic structures. The patient's family was asked during the procedure if we should proceed with LSO as there was left ovarian cyst. Consent was given and decision made to proceed with removal of the left ovary. The left infundibulopelvic ligament was identified, cauterized and cut using the 5 mm Ligasure. The round ligament and ovarian ligaments were sequentially cauterized and cut using the 42mm Ligasure. The bladder flap was developed anteriorly.  The right fallopian tube was identified and followed to its fimbriated end. The proximal portion of the right fallopian tube was cauterized and cut. The right upper pedicle was then isolated. The right utero-ovarian ligament was cauterized and cut. The bladder flap was developed further. The uterine arteries were coagulated and transected bilaterally using the 63mm Ligasure. The endoshears on monopolar was then used to circumscribe the [facility] ring and free the uterus and cervix on the left and anteriorly.  As the left was being cauterized there was transection of an accessory artery and there was notable bleeding.  Visualization was poor. Irrigation was performed and another accessory left mid abdominal 5mm port was placed.  The artery was clamped with the Ligasure and cauterized and the bleeding controlled.  The patient remained hemodynamically stable throughout the episode.  The Endo-hook was used to continue the transection of the uterus around the [place] ring.  The uterus was then pulled into the vagina.    At this point we felt we were ready to proceed with the vaginal portion of the procedure to close the cuff. Specimen was sent off to pathology.  The patient was placed in a more

## 2016-11-19 ENCOUNTER — Ambulatory Visit
Admission: RE | Admit: 2016-11-19 | Discharge: 2016-11-19 | Disposition: A | Discharge status: Home / Self Care | Payer: 59 | Source: Ambulatory Visit | Attending: Neurology | Admitting: Neurology

## 2016-11-19 DIAGNOSIS — Z90710 Acquired absence of both cervix and uterus: Secondary | ICD-10-CM

## 2016-11-19 DIAGNOSIS — R202 Paresthesia of skin: Secondary | ICD-10-CM

## 2016-11-19 MED ORDER — GADOBENATE DIMEGLUMINE 529 MG/ML IV SOLN
19.0000 mL | Freq: Once | INTRAVENOUS | Status: AC | PRN
  Administered 2016-11-19: 19 mL via INTRAVENOUS

## 2016-11-21 ENCOUNTER — Telehealth: Payer: Self-pay | Admitting: Neurology

## 2016-11-21 DIAGNOSIS — M79606 Pain in leg, unspecified: Secondary | ICD-10-CM

## 2016-11-21 NOTE — Telephone Encounter (Signed)
Pt called request MRI results. Said she was advised by GI the results would be ready the next day. I advised her it can take up to 4 days for the MRI to get read.

## 2016-11-22 NOTE — Telephone Encounter (Signed)
Please call patient, MRI of the pelvic showed mild congenital L5 pars interarticularis defect, with mild bilateral foraminal narrowing, otherwise no acute abnormality  IMPRESSION: 1. No hip fracture, dislocation or avascular necrosis. 2. Incidental note made of bilateral L5 pars interarticularis defects with mild bilateral foraminal narrowing.

## 2016-11-22 NOTE — Telephone Encounter (Signed)
I have spoken with Jami this morning and per YY, relayed MRI results as below.  Pt. requested to speak with Dr. Shela Leff

## 2016-11-22 NOTE — Telephone Encounter (Signed)
Both on and my nurse explained to her MRI pelvic findings, she complains of constant pain, scheduled for EMG nerve conduction study on Dec 06 2016,  Could not tolerate these higher dose of neuropathic pain medications, now only taking Cymbalta 20 mg a day, gabapentin 100 mg a day,  After discuss with patient decided to refer her to pain management.

## 2016-11-22 NOTE — Addendum Note (Signed)
Addended by: Marcial Pacas on: 11/22/2016 10:42 AM   Modules accepted: Orders

## 2016-11-25 ENCOUNTER — Other Ambulatory Visit: Payer: 59

## 2016-12-02 NOTE — Telephone Encounter (Signed)
Pt calling because she has not been contacted by anyone re: being set up for an appointment for her pain management.  Pt is asking that it be set for [facility] so she does not have to come to [place], please call.

## 2016-12-02 NOTE — Telephone Encounter (Signed)
Called and spoke to patient she is going going to stay with [facility]. Patient also has there number.

## 2016-12-06 ENCOUNTER — Ambulatory Visit (INDEPENDENT_AMBULATORY_CARE_PROVIDER_SITE_OTHER): Payer: 59 | Admitting: Neurology

## 2016-12-06 DIAGNOSIS — Z90710 Acquired absence of both cervix and uterus: Secondary | ICD-10-CM

## 2016-12-06 DIAGNOSIS — R202 Paresthesia of skin: Secondary | ICD-10-CM | POA: Diagnosis not present

## 2016-12-06 NOTE — Progress Notes (Addendum)
PATIENT: Laurie Hutchinson DOB: 08/01/1980  No chief complaint on file.    HISTORICAL  Laurie Hutchinson is a 36 year old right-handed female, seen in refer by her gynecologist Dr. Sanjuana Kava for evaluation of left leg numbness and pain since her hysterectomy, initial evaluation was on October 30 2016.  She had a history of total laparoscopic hysterectomy bilateral salphingoopherectomy, cystoscopy on October 09 2016 for diagnosis of severe dysmenorrhea, abnormal uterine bleeding, uterine fibroid.  Per record, she underwent surgery via general anesthesia, was placed in a low anterior lithotomy position, as soon as she woke up from anesthesia, she noticed whole left leg numbness and itching sensation at left anterior thigh, she wants to scratch her left thigh badly, the paresthesia has been persistent since then, she described it as if her left leg is waking up from Novocain shots, tingling, sometimes painful sensation, heightened sensation with light touch at the left anterior, lateral thigh, extending to left medial leg, involving whole left foot, top and bottom, also stretchy sensation around her left knee with prolonged sitting.  She complains of worsening left leg pain with ambulating, she is so frustrated about her symptoms, is tearful during interview.    She was given prescription of gabapentin 300 mg 3 times a day, which has helped her sleep for 3-4 hours each day, but cause drowsiness during daytime, overall her symptoms have mild improvement, she no longer has severe left foot pain burning sensation, but still had persistent left leg and foot paresthesia.  Pathology  of her total hysterectomy are consistent of with chronic inflammation, leiomyoma,  Laboratory evaluation showed normal CBC, hemoglobin of 13 point 8,  She denies significant low back pain, she continue has mild low pelvic pain, more on the left side, require oxycodone 5/325 mg every 6 hours,  Update Dec 06 2016: Patient returned for electrodiagnostic study on Dec 06 2016, there was no significant abnormality, in specific, there was no evidence of left lower extremity neuropathy, left femoral neuropathy, left lumbar sacral radiculopathy or left lumbosacral plexopathy.  She continues to complaining significant left leg paresthesia, neuropathic pain, mainly involving left median, anterior thigh, extending to left medial leg, bottom of her left foot, she denied weakness.  She is receiving physical therapy, which has helped her some, but complains of worsening muscle and nerve pain afterwards.  We had multiple phone calls since last visit in April 2018, she is very sensitive to medications, currently taking Cymbalta 20 mg in the morning, along with gabapentin 100 mg up to 3 tablets a day, 300 mg gabapentin cause excessive drowsiness.   MRI of pelvic with without contrast on Nov 19 2016 was normal, other than the incidental note of bilateral L5 pars interarticularis defect, with mild bilateral foraminal narrowing  REVIEW OF SYSTEMS: Full 14 system review of systems performed and notable only for as above ALLERGIES: Allergies  Allergen Reactions  . Other Anaphylaxis and Rash    Not sure of drug name but it was given to her during surgery in 2010  . Erythromycin     Abdominal cramping  . Neosporin [Neomycin-Bacitracin Zn-Polymyx] Itching    HOME MEDICATIONS: Current Outpatient Prescriptions  Medication Sig Dispense Refill  . cephALEXin (KEFLEX) 500 MG capsule Take 500 mg by mouth 4 (four) times daily. X 10 days    . DULoxetine (CYMBALTA) 20 MG capsule Take 1 capsule (20 mg total) by mouth daily. 30 capsule 5  . estradiol (VIVELLE-DOT) 0.1 MG/24HR patch Place 1 patch  onto the skin once a week.    . gabapentin (NEURONTIN) 300 MG capsule Take 300 mg by mouth at bedtime.     Marland Kitchen ibuprofen (ADVIL,MOTRIN) 800 MG tablet Take 1 tablet (800 mg total) by mouth every 8 (eight) hours as needed (mild pain). 60  tablet 3  . linaclotide (LINZESS) 145 MCG CAPS capsule Take 145 mcg by mouth daily.    . Multiple Vitamins-Minerals (ADULT GUMMY PO) Take 2 tablets by mouth daily. With iron    . nystatin (MYCOSTATIN/NYSTOP) powder Apply topically as needed.    Marland Kitchen oxyCODONE-acetaminophen (PERCOCET/ROXICET) 5-325 MG tablet Take 1-2 tablets by mouth every 4 (four) hours as needed (moderate to severe pain (when tolerating fluids)). 45 tablet 0   No current facility-administered medications for this visit.     PAST MEDICAL HISTORY: Past Medical History:  Diagnosis Date  . ADD (attention deficit disorder)   . Anemia   . Cancer (HCC)    cervical cancer  . Deviated nasal septum   . GERD (gastroesophageal reflux disease)   . Heart murmur   . Heart palpitations   . Hypertension    with pregnancy  . Hypoglycemia   . Left leg pain   . Migraine headache   . Panic attack   . Pneumonia   . Rheumatic fever     PAST SURGICAL HISTORY: Past Surgical History:  Procedure Laterality Date  . COLPOSCOPY    . CYSTOSCOPY N/A 10/09/2016   Procedure: CYSTOSCOPY;  Surgeon: Sanjuana Kava, MD;  Location: [facility];  Service: Gynecology;  Laterality: N/A;  . DILATION AND CURETTAGE OF UTERUS    . NASAL SEPTUM SURGERY    . TOTAL LAPAROSCOPIC HYSTERECTOMY WITH SALPINGECTOMY Bilateral 10/09/2016   Procedure: HYSTERECTOMY TOTAL LAPAROSCOPIC WITH SALPINGECTOMY;  Surgeon: Sanjuana Kava, MD;  Location: [facility];  Service: Gynecology;  Laterality: Bilateral;  . TUBAL LIGATION      FAMILY HISTORY: Family History  Problem Relation Age of Onset  . Hypertension Mother   . Thyroid disease Mother   . Heart disease Father   . Heart attack Maternal Grandmother   . Heart attack Maternal Grandfather   . Heart attack Paternal Grandmother   . Heart attack Paternal Grandfather     SOCIAL HISTORY:  Social History   Social History  . Marital status: Single    Spouse name: N/A  . Number of children: 3  . Years of education: Associates    Occupational History  . Property Manager    Social History Main Topics  . Smoking status: Former Smoker    Packs/day: 1.00    Years: 20.00    Types: Cigarettes  . Smokeless tobacco: Never Used     Comment: Quit 10/09/16  . Alcohol use No     Comment: Not used since 2008  . Drug use: No  . Sexual activity: Yes    Birth control/ protection: None   Other Topics Concern  . Not on file   Social History Narrative   Lives at home with daughter.   Right-handed.   2-3 cups caffeine per day.     PHYSICAL EXAM   There were no vitals filed for this visit.  Not recorded      There is no height or weight on file to calculate BMI.  PHYSICAL EXAMNIATION:  Gen: NAD, conversant, well nourised, obese, well groomed                     Cardiovascular: Regular rate rhythm, no  peripheral edema, warm, nontender. Eyes: Conjunctivae clear without exudates or hemorrhage Neck: Supple, no carotid bruits. Pulmonary: Clear to auscultation bilaterally   NEUROLOGICAL EXAM:  MENTAL STATUS: Speech:    Speech is normal; fluent and spontaneous with normal comprehension.  Cognition:     Orientation to time, place and person     Normal recent and remote memory     Normal Attention span and concentration     Normal Language, naming, repeating,spontaneous speech     Fund of knowledge   CRANIAL NERVES: CN II: Visual fields are full to confrontation.   Pupils are round equal and briskly reactive to light. CN III, IV, VI: extraocular movement are normal. No ptosis. CN V: Facial sensation is intact to pinprick in all 3 divisions bilaterally. Corneal responses are intact.  CN VII: Face is symmetric with normal eye closure and smile. CN VIII: Hearing is normal to rubbing fingers CN IX, X: Palate elevates symmetrically. Phonation is normal. CN XI: Head turning and shoulder shrug are intact CN XII: Tongue is midline with normal movements and no atrophy.  MOTOR: Confrontational test of left lower  extremities limited by pain, but there was no significant weakness noted, rest of muscle strength were normal.  REFLEXES: Reflexes are 2+ and symmetric at the biceps, triceps, and ankle, there was mild asymmetry of bilateral patellas reflex, right side is 2, left side is 1. Plantar responses are flexor.  SENSORY:  Decreased light touch pinprick at left anterior and lateral thigh, extending to left anterior medial leg, top and heel of left foot.  COORDINATION: Rapid alternating movements and fine finger movements are intact. There is no dysmetria on finger-to-nose and heel-knee-shin.    GAIT/STANCE: Mildly antalgic due to left leg, foot pain.   DIAGNOSTIC DATA (LABS, IMAGING, TESTING) - I reviewed patient records, labs, notes, testing and imaging myself where available.   ASSESSMENT AND PLAN  Laurie Hutchinson is a 36 y.o. female   Left leg paresthesia  In the distribution of left intermediate femoral cutaneous, left saphenous, lateral obturator, left common peroneal and distal tibial nerve territory.  There was no evidence of left lower extremity neuropathy, left lumbosacral plexopathy or left lumbosacral radiculopathy based on today's electrical diagnostic testing  Continue with neuropathic pain medications  Cymbalta, gabapentin, I have suggested her titrating Cymbalta to 40 mg every night, take low-dose gabapentin 100 mg throughout the day, limit to 600 mg daily,    Marcial Pacas, M.D. Ph.D.  [facility] [address] [place], [place] [address] Ph: [phone] Fax: [phone]  CC: Sanjuana Kava, MD

## 2016-12-06 NOTE — Procedures (Signed)
Full Name: Laurie Hutchinson Gender: Female MRN #: [id number] Date of Birth: 06/20/1981    Visit Date: 12/06/2016 09:22 Age: 36 Years 13 Months Old Examining Physician: Marcial Pacas, MD  Referring Physician: Krista Blue, MD History:  36 year old female complains of left lower extremity paresthesia extending to left foot following her hysterectomy in March 2018  Summary of the test: Nerve conduction study: Left sural, superficial peroneal sensory responses were normal. Left peroneal to EDB, and tibial motor responses were normal.  Electromyography: Selected needle examination was performed at left lower extremity muscles left lumbosacral paraspinal muscles, there was no significant abnormality noted.   Conclusion: This is a normal study, there is NO electrodiagnostic evidence of left lower extremity neuropathy, left lumbosacral radiculopathy, left lumbar radiculopathy.    ------------------------------- Marcial Pacas, M.D.  [facility] [place], [place] [address] Tel: [phone] Fax: [phone]        [facility]    Nerve / Sites Rec. Site Latency Ref. Amplitude Ref. Rel Amp Segments Distance Velocity Ref. Area    ms ms mV mV %  cm m/s m/s mVms  L Peroneal - EDB     Ankle EDB 3.9 ?6.5 7.2 ?2.0 100 Ankle - EDB 9   18.3     Fib head EDB 9.5  6.8  94.4 Fib head - Ankle 29 52 ?44 17.9     Pop fossa EDB 11.4  6.3  93.7 Pop fossa - Fib head 10 53 ?44 17.1         Pop fossa - Ankle      L Tibial - AH     Ankle AH 4.0 ?5.8 17.9 ?4.0 100 Ankle - AH 9   39.3     Pop fossa AH 11.9  15.2  84.9 Pop fossa - Ankle 35 44 ?41 35.2         SNC    Nerve / Sites Rec. Site Peak Lat Ref.  Amp Ref. Segments Distance    ms ms V V  cm  L Sural - Ankle (Calf)     Calf Ankle 3.33 ?4.40 14 ?6 Calf - Ankle 14  L Superficial peroneal - Ankle     Lat leg Ankle 3.65 ?4.40 8 ?6 Lat leg - Ankle 14             F  Wave    Nerve F Lat Ref.   ms ms  L Tibial - AH 46.8 ?56.0        EMG full       EMG Summary Table    Spontaneous MUAP Recruitment  Muscle IA Fib PSW Fasc Other Amp Dur. Poly Pattern  L. Tibialis anterior Normal None None None _______ Normal Normal Normal Normal  L. Gastrocnemius (Medial head) Normal None None None _______ Normal Normal Normal Normal  L. Vastus lateralis Normal None None None _______ Normal Normal Normal Normal  L. Biceps femoris (long head) Normal None None None _______ Normal Normal Normal Normal  L. Biceps femoris (short head) Normal None None None _______ Normal Normal Normal Normal  L. Iliopsoas Normal None None None _______ Normal Normal Normal Normal  L. Adductor longus Normal None None None _______ Normal Normal Normal Normal  L. Gluteus medius  Normal None None None _______ Normal Normal Normal Normal  L. Lumbar paraspinals (mid) Normal None None None _______ Normal Normal Normal Normal  L. Lumbar paraspinals (low) Normal None None None _______ Normal Normal Normal Normal

## 2016-12-14 NOTE — Addendum Note (Signed)
Addendum  created 12/14/16 1038 by Duane Boston, MD   Sign clinical note

## 2016-12-16 NOTE — Telephone Encounter (Signed)
Pt calling requesting the results from her EMG and NCS be sent to her pain management asap.  She has been told by them that she can not be seen until they receive the results.

## 2016-12-17 ENCOUNTER — Telehealth: Payer: Self-pay | Admitting: Neurology

## 2016-12-17 NOTE — Telephone Encounter (Signed)
Patient want's a copy of her medical records . I relayed to patient you would call her with process she was fine with that.   I faxed her NCV/ EMG to pain Mgt. Where we referred  her.  Thanks Hilda Blades.

## 2016-12-18 ENCOUNTER — Encounter: Payer: Self-pay | Admitting: Neurology

## 2016-12-23 ENCOUNTER — Telehealth: Payer: Self-pay | Admitting: Neurology

## 2016-12-23 ENCOUNTER — Encounter: Payer: Self-pay | Admitting: Neurology

## 2016-12-24 ENCOUNTER — Telehealth: Payer: Self-pay | Admitting: Neurology

## 2016-12-24 NOTE — Telephone Encounter (Signed)
I have talked with patient and explained that EMG nerve conduction study was normal in May 2018, she continue complaining significant left leg paresthesia, is frustrated about her symptoms,  She is now taking Cymbalta 20 mg 2 capsules every day, still has GI side effect, could not tolerate gabapentin complains of drowsiness, was given a new prescription of Lyrica 75 mg, will try to take it,  I also advised her to take vitamin B complex,  Please mail her most recent office note on Dec 06 2016 to her home address.

## 2016-12-25 NOTE — Telephone Encounter (Signed)
RN call patient about getting copies of her office notes. Rn stated she will have to fill out a release of information to get copies of her EMG results and Dr. Krista Blue office note from April 2018. PT requested a copy of the AVS from the April visit. Pt verbalized understanding. Rn verify address. Rn sent pt a Therapist, music.

## 2016-12-25 NOTE — Telephone Encounter (Signed)
Patient verbalized understanding  

## 2016-12-27 NOTE — Telephone Encounter (Signed)
error 

## 2017-02-10 ENCOUNTER — Encounter: Payer: Self-pay | Admitting: Neurology

## 2017-02-11 ENCOUNTER — Telehealth: Payer: Self-pay | Admitting: Neurology

## 2017-02-11 ENCOUNTER — Other Ambulatory Visit: Payer: Self-pay | Admitting: *Deleted

## 2017-02-11 DIAGNOSIS — R202 Paresthesia of skin: Secondary | ICD-10-CM

## 2017-02-11 NOTE — Telephone Encounter (Signed)
Please call patient to notify her that she should have access of her medical record through mychart.

## 2017-02-11 NOTE — Telephone Encounter (Signed)
Patient has obtained copies of her records.  She wishes to have second opinion at [facility].  Dr. Krista Blue has authorized the referral.  Order placed in [place].

## 2017-03-05 ENCOUNTER — Other Ambulatory Visit: Payer: Self-pay | Admitting: Podiatry

## 2017-03-12 ENCOUNTER — Encounter: Payer: Self-pay | Admitting: Family Medicine

## 2017-03-12 ENCOUNTER — Ambulatory Visit (INDEPENDENT_AMBULATORY_CARE_PROVIDER_SITE_OTHER): Payer: 59 | Admitting: Family Medicine

## 2017-03-12 VITALS — BP 126/78 | HR 97 | Temp 97.9°F | Resp 16 | Ht 62.0 in | Wt 202.8 lb

## 2017-03-12 DIAGNOSIS — R350 Frequency of micturition: Secondary | ICD-10-CM | POA: Diagnosis not present

## 2017-03-12 DIAGNOSIS — F324 Major depressive disorder, single episode, in partial remission: Secondary | ICD-10-CM | POA: Insufficient documentation

## 2017-03-12 DIAGNOSIS — R5383 Other fatigue: Secondary | ICD-10-CM | POA: Diagnosis not present

## 2017-03-12 DIAGNOSIS — R3981 Functional urinary incontinence: Secondary | ICD-10-CM | POA: Insufficient documentation

## 2017-03-12 DIAGNOSIS — R0981 Nasal congestion: Secondary | ICD-10-CM | POA: Diagnosis not present

## 2017-03-12 DIAGNOSIS — E8941 Symptomatic postprocedural ovarian failure: Secondary | ICD-10-CM

## 2017-03-12 DIAGNOSIS — R358 Other polyuria: Secondary | ICD-10-CM | POA: Diagnosis not present

## 2017-03-12 MED ORDER — ESTRADIOL 1 MG PO TABS: 1.0000 mg | ORAL_TABLET | Freq: Two times a day (BID) | ORAL | 1 refills | Status: DC

## 2017-03-12 MED ORDER — MOMETASONE FUROATE 50 MCG/ACT NA SUSP: 2.0000 | Freq: Every day | NASAL | 12 refills | Status: DC

## 2017-03-12 MED ORDER — VENLAFAXINE HCL ER 150 MG PO CP24: 150.0000 mg | ORAL_CAPSULE | Freq: Every day | ORAL | 1 refills | Status: DC

## 2017-03-12 MED ORDER — FLUTICASONE PROPIONATE 50 MCG/ACT NA SUSP: 2.0000 | Freq: Every day | NASAL | 12 refills | Status: DC

## 2017-03-12 NOTE — Progress Notes (Signed)
Subjective:    Patient ID: Laurie Hutchinson, female    DOB: [birth year], 36 y.o.   MRN: [id number]  Here for follow up. Reports that has had surgery in 09/2016 for hysterectomy. Has been suffering with hot flashes since the surgery. Has previoiusly been followed by Dr. Alwyn Pea for this but would like a PCP. Cannot sleep well b/c of the hotflashes. Wakes up multiple times per night. Has had to leave the fan on at night b/c of the hot flashes and then gets nasal congestion. Fiance tells patient that when sleeping at night snores terribly and he is concerned that has some breathing issues.Reports that has gained weight, about 20 pounds since the surgery. Has had urinary frequency since the surgery. Was told that it would get better.  No dysuria. Has never been diagnosed with diabetes. No dysuria. Has not had incontinence. Seen by Dr. Alwyn Pea since the surgery and told that this would get better.   Has been seen by Gyn for hot flashes. Would like to increase estrogen therapy. Used the patch initially but it would not stay on and she did not like it. Reports that the hot flashes are interfering with life. Sweats terribly under arms, face, chest. Gets flushed with sweats and very uncomfortable. Does want dose increased. Reports that she understands the risk of the estrogen but cannot stand the hot flashes. Reports that it interferes with sleep and then feels bad the next day. Reports that b/c of hot flashes has to use fan to sleep and her fiance is now sleeping in another room b/c he does not like it and she is snoring. Reports that this is putting more stress on her and relationship.No vaginal bleeding. No pelvic pain.    Depression         This is a recurrent problem.  The current episode started 1 to 4 weeks ago.   The onset quality is gradual.   The problem occurs daily.  The problem has been gradually improving since onset.  Associated symptoms include decreased concentration, fatigue, hopelessness,  insomnia, irritable, restlessness, decreased interest, appetite change and sad.  Associated symptoms include no suicidal ideas.     The symptoms are aggravated by social issues (Has gotten better with medicaiton but still not normal self per patient. ).  Treatments tried: has been on the Effexor XR75 mg  for over two months.   Compliance with treatment is good.  Previous treatment provided mild relief.  Risk factors include a change in medication usage/dosage, a recent illness, major life event and stress.   Past medical history includes recent illness and physical disability.     Pertinent negatives include no suicide attempts.  (Being seen by neurology for leg pain and being seen by the pain clinic. ) Urinary Frequency   This is a chronic problem. The current episode started more than 1 month ago. The problem occurs intermittently. The problem has been unchanged. Quality: there is not associated pain. The patient is experiencing no pain. There has been no fever. She is not sexually active. There is no history of pyelonephritis. Associated symptoms include frequency. Pertinent negatives include no chills, discharge, flank pain, hematuria, hesitancy, possible pregnancy, urgency or vomiting. She has tried nothing for the symptoms. There is no history of catheterization, kidney stones or recurrent UTIs. Being seen by neurology for leg pain and being seen by the pain clinic.       Review of Systems  Constitutional: Positive for appetite change and fatigue. Negative  for chills and fever.       Has gained weight over the past several months, but reports that has not been able to exercise and has not been as active b/c of her surgery and leg pain. Reports that the weight gain makes her feel even worse about herself b/c she used to weigh a lot less.   HENT: Positive for congestion. Negative for dental problem, nosebleeds, postnasal drip, sinus pressure, sneezing, sore throat, trouble swallowing and voice change.         Has a history of anatomical right sinus issue and has had surgery in the past. Gets nasal congestion. Does not have seasonal allergies that she knows of. Feels congested on the right side, but does not have pain or nasal discharge.   Gastrointestinal: Negative for vomiting.  Genitourinary: Positive for frequency. Negative for decreased urine volume, difficulty urinating, dysuria, enuresis, flank pain, hematuria, hesitancy, pelvic pain, urgency, vaginal bleeding, vaginal discharge and vaginal pain.  Neurological: Negative for tremors, seizures and syncope.  Hematological: Negative for adenopathy.  Psychiatric/Behavioral: Positive for decreased concentration, depression and sleep disturbance. Negative for agitation, hallucinations and suicidal ideas. The patient is nervous/anxious and has insomnia.        Reports that gets down when has to deal with this chronic pain. Has been seen at the pain clinic for medications.    Immunization History  Administered Date(s) Administered  . Influenza,inj,Quad PF,6+ Mos 10/10/2016   Social History   Social History  . Marital status: Single    Spouse name: N/A  . Number of children: 3  . Years of education: Associates   Occupational History  . Property Manager    Social History Main Topics  . Smoking status: Heavy Tobacco Smoker    Packs/day: 0.50    Years: 20.00    Types: Cigarettes  . Smokeless tobacco: Never Used     Comment: Quit 10/09/16  . Alcohol use No     Comment: Not used since 2008  . Drug use: No  . Sexual activity: Yes    Birth control/ protection: None   Other Topics Concern  . Not on file   Social History Narrative   Lives at home with daughter.   Right-handed.   2-3 cups caffeine per day.   Past Medical History:  Diagnosis Date  . ADD (attention deficit disorder)   . Anemia   . Cancer (HCC)    cervical cancer  . Depression   . Deviated nasal septum   . GERD (gastroesophageal reflux disease)   . Heart murmur     . Heart palpitations   . Hypertension    with pregnancy  . Hypoglycemia   . Left leg pain   . Migraine headache   . Panic attack   . Pneumonia   . Rheumatic fever    Past Surgical History:  Procedure Laterality Date  . ABDOMINAL HYSTERECTOMY    . COLPOSCOPY    . CYSTOSCOPY N/A 10/09/2016   Procedure: CYSTOSCOPY;  Surgeon: Sanjuana Kava, MD;  Location: [facility];  Service: Gynecology;  Laterality: N/A;  . DILATION AND CURETTAGE OF UTERUS    . NASAL SEPTUM SURGERY    . TOTAL LAPAROSCOPIC HYSTERECTOMY WITH SALPINGECTOMY Bilateral 10/09/2016   Procedure: HYSTERECTOMY TOTAL LAPAROSCOPIC WITH SALPINGECTOMY;  Surgeon: Sanjuana Kava, MD;  Location: [facility];  Service: Gynecology;  Laterality: Bilateral;  . TUBAL LIGATION         Current Outpatient Prescriptions on File Prior to Visit  Medication Sig Dispense  Refill  . gabapentin (NEURONTIN) 300 MG capsule Take 300 mg by mouth at bedtime.     Marland Kitchen linaclotide (LINZESS) 145 MCG CAPS capsule Take 145 mcg by mouth daily.    . Multiple Vitamins-Minerals (ADULT GUMMY PO) Take 2 tablets by mouth daily. With iron     No current facility-administered medications on file prior to visit.     Objective:   Physical Exam  Constitutional: She appears well-developed and well-nourished. She is irritable.  Eyes: Pupils are equal, round, and reactive to light. Conjunctivae are normal.  Neck: Normal range of motion. Neck supple. No JVD present. No tracheal deviation present. No thyromegaly present.  Cardiovascular: Normal rate, regular rhythm and normal heart sounds.  Exam reveals no gallop.   No murmur heard. Pulmonary/Chest: Effort normal and breath sounds normal. No respiratory distress. She has no wheezes.  Neurological: She is alert. No cranial nerve deficit.  Skin: Skin is warm and dry. No erythema.  Psychiatric: Judgment and thought content normal. Her affect is blunt. Her affect is not angry, not labile and not inappropriate. Her speech is not slurred. She  is slowed. Thought content is not delusional. Cognition and memory are not impaired. She does not express impulsivity or inappropriate judgment. She exhibits a depressed mood. She expresses no homicidal and no suicidal ideation. She exhibits normal recent memory and normal remote memory.  Dysphoric mood. Affect consistent with mood. Speech slightly slowed. Does smile at times in exam. Does become tearful when talking about chronic issues with pain in leg from surgery.   Vitals reviewed.  Vitals:   03/12/17 0936  BP: 126/78  Pulse: 97  Resp: 16  Temp: 97.9 F (36.6 C)  SpO2: 97%          Assessment & Plan:  1. Fatigue, unspecified type, suspect secondary to mood disorder and chornic pain.  Check labs to rule out anemia or thyroid disorder.  - TSH - CBC with Differential  2. Frequency of urination and polyuria Suspect urinary frequency due to gynecology surgery. Defers referral to urology at this time. Has been reassured by gynecologist that should improve over time. Counseled regarding worrisome s/s and of UTI. If develop, will RTC or call. Check blood sugar to rule out diabetes as a cause of polyuria since has gained a substantial amount of weight in the past five months.  - Glucose  3. Hot flashes due to surgical menopause, severe, interfering with daily life and activities.   - estradiol (ESTRACE) 1 MG tablet; Take 1 tablet (1 mg total) by mouth 2 (two) times daily.  Dispense: 60 tablet; Refill: 1 -Counseled in detail regarding side effects of medications.  Discussed with patient that she should decrease caffeine. Sleep hygiene discussed. Lifestyle changes to help with hot flashes discussed.  Discussed that increasing the Effexor for mood could improve her symptoms too.  -Pt to increase the estradiol to the smallest possible dose, for the shortest duration.  Resources to read about   4. Nasal congestion Referral to ENT.  Trial of nasonex nasal spray as directed.  Refer to  ENT b/c of symptoms worrisome for sleep apnea, could be exacerbated due to weight gain, congestion, etc.  - mometasone (NASONEX) 50 MCG/ACT nasal spray; Place 2 sprays into the nose daily.  Dispense: 17 g; Refill: 12  5.  Depression, uncontrolled but improved. Suicide precautions given. Patient voiced understanding.  Defers therapy at this time.  Will increase Effexor to 150 mg po qd.  Follow up in one  month. Discussed risks and benefits of medications. Compliance and withdrawal of medications discussed.  Call with question or concerns.   Outpatient Encounter Prescriptions as of 03/12/2017  Medication Sig  . cyanocobalamin 500 MCG tablet Take 500 mcg by mouth daily.  Marland Kitchen gabapentin (NEURONTIN) 100 MG capsule Take 100 mg by mouth 3 (three) times daily.  Marland Kitchen gabapentin (NEURONTIN) 300 MG capsule Take 300 mg by mouth at bedtime.   Marland Kitchen HYDROcodone-acetaminophen (NORCO) 7.5-325 MG tablet Take 1 tablet by mouth every 6 (six) hours as needed for moderate pain.  Marland Kitchen linaclotide (LINZESS) 145 MCG CAPS capsule Take 145 mcg by mouth daily.  . Multiple Vitamins-Minerals (ADULT GUMMY PO) Take 2 tablets by mouth daily. With iron  . [DISCONTINUED] estradiol (ESTRACE) 0.5 MG tablet Take 0.5 mg by mouth 2 (two) times daily.  . [DISCONTINUED] venlafaxine XR (EFFEXOR XR) 75 MG 24 hr capsule Effexor XR 75 mg capsule,extended release  Take 1 capsule every day by oral route.  Marland Kitchen estradiol (ESTRACE) 1 MG tablet Take 1 tablet (1 mg total) by mouth 2 (two) times daily.  . mometasone (NASONEX) 50 MCG/ACT nasal spray Place 2 sprays into the nose daily.  Marland Kitchen venlafaxine XR (EFFEXOR XR) 150 MG 24 hr capsule Take 1 capsule (150 mg total) by mouth daily with breakfast.  . [DISCONTINUED] cephALEXin (KEFLEX) 500 MG capsule Take 500 mg by mouth 4 (four) times daily. X 10 days  . [DISCONTINUED] DULoxetine (CYMBALTA) 20 MG capsule Take 1 capsule (20 mg total) by mouth daily. (Patient not taking: Reported on 03/12/2017)  . [DISCONTINUED]  estradiol (VIVELLE-DOT) 0.1 MG/24HR patch Place 1 patch onto the skin once a week.  . [DISCONTINUED] ibuprofen (ADVIL,MOTRIN) 800 MG tablet Take 1 tablet (800 mg total) by mouth every 8 (eight) hours as needed (mild pain). (Patient not taking: Reported on 03/12/2017)  . [DISCONTINUED] nystatin (MYCOSTATIN/NYSTOP) powder Apply topically as needed.  . [DISCONTINUED] oxyCODONE-acetaminophen (PERCOCET/ROXICET) 5-325 MG tablet Take 1-2 tablets by mouth every 4 (four) hours as needed (moderate to severe pain (when tolerating fluids)). (Patient not taking: Reported on 03/12/2017)   No facility-administered encounter medications on file as of 03/12/2017.

## 2017-03-12 NOTE — Addendum Note (Signed)
Addended by: Merceda Elks on: 03/12/2017 03:03 PM   Modules accepted: Orders

## 2017-03-12 NOTE — Patient Instructions (Signed)
Laurie Hutchinson  03/12/2017     @PREFPERIOPPHARMACY @   Your procedure is scheduled on  03/26/2017   Report to [facility] at  700  A.M.  Call this number if you have problems the morning of surgery:  [phone]   Remember:  Do not eat food or drink liquids after midnight.  Take these medicines the morning of surgery with A SIP OF WATER  Cymbalta, neurontin, oxycodone.   Do not wear jewelry, make-up or nail polish.  Do not wear lotions, powders, or perfumes, or deoderant.  Do not shave 48 hours prior to surgery.  Men may shave face and neck.  Do not bring valuables to the hospital.  [facility] is not responsible for any belongings or valuables.  Contacts, dentures or bridgework may not be worn into surgery.  Leave your suitcase in the car.  After surgery it may be brought to your room.  For patients admitted to the hospital, discharge time will be determined by your treatment team.  Patients discharged the day of surgery will not be allowed to drive home.   Name and phone number of your driver:   family Special instructions:  None  Please read over the following fact sheets that you were given. Anesthesia Post-op Instructions and Care and Recovery After Surgery                 Incision Care, Adult An incision is a cut that a doctor makes in your skin for surgery (for a procedure). Most times, these cuts are closed after surgery. Your cut from surgery may be closed with stitches (sutures), staples, skin glue, or skin tape (adhesive strips). You may need to return to your doctor to have stitches or staples taken out. This may happen many days or many weeks after your surgery. The cut needs to be well cared for so it does not get infected. How to care for your cut Cut care  Follow instructions from your doctor about how to take care of your cut. Make sure you: ? Wash your hands with soap and water before you change your bandage (dressing). If you cannot use  soap and water, use hand sanitizer. ? Change your bandage as told by your doctor. ? Leave stitches, skin glue, or skin tape in place. They may need to stay in place for 2 weeks or longer. If tape strips get loose and curl up, you may trim the loose edges. Do not remove tape strips completely unless your doctor says it is okay.  Check your cut area every day for signs of infection. Check for: ? More redness, swelling, or pain. ? More fluid or blood. ? Warmth. ? Pus or a bad smell.  Ask your doctor how to clean the cut. This may include: ? Using mild soap and water. ? Using a clean towel to pat the cut dry after you clean it. ? Putting a cream or ointment on the cut. Do this only as told by your doctor. ? Covering the cut with a clean bandage.  Ask your doctor when you can leave the cut uncovered.  Do not take baths, swim, or use a hot tub until your doctor says it is okay. Ask your doctor if you can take showers. You may only be allowed to take sponge baths for bathing. Medicines  If you were prescribed an antibiotic medicine, cream, or ointment, take the antibiotic or put it on the cut as told by your doctor. Do  not stop taking or putting on the antibiotic even if your condition gets better.  Take over-the-counter and prescription medicines only as told by your doctor. General instructions  Limit movement around your cut. This helps healing. ? Avoid straining, lifting, or exercise for the first month, or for as long as told by your doctor. ? Follow instructions from your doctor about going back to your normal activities. ? Ask your doctor what activities are safe.  Protect your cut from the sun when you are outside for the first 6 months, or for as long as told by your doctor. Put on sunscreen around the scar or cover up the scar.  Keep all follow-up visits as told by your doctor. This is important. Contact a doctor if:  Your have more redness, swelling, or pain around the  cut.  You have more fluid or blood coming from the cut.  Your cut feels warm to the touch.  You have pus or a bad smell coming from the cut.  You have a fever or shaking chills.  You feel sick to your stomach (nauseous) or you throw up (vomit).  You are dizzy.  Your stitches or staples come undone. Get help right away if:  You have a red streak coming from your cut.  Your cut bleeds through the bandage and the bleeding does not stop with gentle pressure.  The edges of your cut open up and separate.  You have very bad (severe) pain.  You have a rash.  You are confused.  You pass out (faint).  You have trouble breathing and you have a fast heartbeat. This information is not intended to replace advice given to you by your health care provider. Make sure you discuss any questions you have with your health care provider. Document Released: 09/23/2011 Document Revised: 03/08/2016 Document Reviewed: 03/08/2016 Elsevier Interactive Patient Education  2017 [place]. Excision of Skin Lesions Excision of a skin lesion refers to the removal of a section of skin by making small cuts (incisions) in the skin. This procedure may be done to remove a cancerous (malignant) or noncancerous (benign) growth on the skin. It is typically done to treat or prevent cancer or infection. It may also be done to improve cosmetic appearance. The procedure may be done to remove:  Cancerous growths, such as basal cell carcinoma, squamous cell carcinoma, or melanoma.  Noncancerous growths, such as a cyst or lipoma.  Growths, such as moles or skin tags, which may be removed for cosmetic reasons.  Various excision or surgical techniques may be used depending on your condition, the location of the lesion, and your overall health. Tell a health care provider about:  Any allergies you have.  All medicines you are taking, including vitamins, herbs, eye drops, creams, and over-the-counter  medicines.  Any problems you or family members have had with anesthetic medicines.  Any blood disorders you have.  Any surgeries you have had.  Any medical conditions you have.  Whether you are pregnant or may be pregnant. What are the risks? Generally, this is a safe procedure. However, problems may occur, including:  Bleeding.  Infection.  Scarring.  Recurrence of the cyst, lipoma, or cancer.  Changes in skin sensation or appearance, such as discoloration or swelling.  Reaction to the anesthetics.  Allergic reaction to surgical materials or ointments.  Damage to nerves, blood vessels, muscles, or other structures.  Continued pain.  What happens before the procedure?  Ask your health care provider about: ?  Changing or stopping your regular medicines. This is especially important if you are taking diabetes medicines or blood thinners. ? Taking medicines such as aspirin and ibuprofen. These medicines can thin your blood. Do not take these medicines before your procedure if your health care provider instructs you not to.  You may be asked to take certain medicines.  You may be asked to stop smoking.  You may have an exam or testing.  Plan to have someone take you home after the procedure.  Plan to have someone help you with activities during recovery. What happens during the procedure?  To reduce your risk of infection: ? Your health care team will wash or sanitize their hands. ? Your skin will be washed with soap.  You will be given a medicine to numb the area (local anesthetic).  One of the following excision techniques will be performed.  At the end of any of these procedures, antibiotic ointment will be applied as needed. Each of the following techniques may vary among health care providers and hospitals. Complete Surgical Excision The area of skin that needs to be removed will be marked with a pen. Using a small scalpel or scissors, the surgeon will  gently cut around and under the lesion until it is completely removed. The lesion will be placed in a fluid and sent to the lab for examination. If necessary, bleeding will be controlled with a device that delivers heat (electrocautery). The edges of the wound may be stitched (sutured) together, and a bandage (dressing) will be applied. This procedure may be performed to treat a cancerous growth or a noncancerous cyst or lesion. Excision of a Cyst The surgeon will make an incision on the cyst. The entire cyst will be removed through the incision. The incision may be closed with sutures. Shave Excision During shave excision, the surgeon will use a small blade or an electrically heated loop instrument to shave off the lesion. This may be done to remove a mole or a skin tag. The wound will usually be left to heal on its own without sutures. Punch Excision During punch excision, the surgeon will use a small tool that is like a cookie cutter or a hole punch to cut a circle shape out of the skin. The outer edges of the skin will be sutured together. This may be done to remove a mole or a scar or to perform a biopsy of the lesion. Mohs Micrographic Surgery During Mohs micrographic surgery, layers of the lesion will be removed with a scalpel or a loop instrument and will be examined right away under a microscope. Layers will be removed until all of the abnormal or cancerous tissue has been removed. This procedure is minimally invasive, and it ensures the best cosmetic outcome. It involves the removal of as little normal tissue as possible. Mohs is usually done to treat skin cancer, such as basal cell carcinoma or squamous cell carcinoma, particularly on the face and ears. Depending on the size of the surgical wound, it may be sutured closed. What happens after the procedure?  Return to your normal activities as told by your health care provider.  Talk with your health care provider to discuss any test results,  treatment options, and if necessary, the need for more tests. This information is not intended to replace advice given to you by your health care provider. Make sure you discuss any questions you have with your health care provider. Document Released: 09/25/2009 Document Revised: 12/07/2015 Document Reviewed:  08/17/2014 Elsevier Interactive Patient Education  2018 [place]. Excision of Skin Lesions, Care After Refer to this sheet in the next few weeks. These instructions provide you with information about caring for yourself after your procedure. Your health care provider may also give you more specific instructions. Your treatment has been planned according to current medical practices, but problems sometimes occur. Call your health care provider if you have any problems or questions after your procedure. What can I expect after the procedure? After your procedure, it is common to have pain or discomfort at the excision site. Follow these instructions at home:  Take over-the-counter and prescription medicines only as told by your health care provider.  Follow instructions from your health care provider about: ? How to take care of your excision site. You should keep the site clean, dry, and protected for at least 48 hours. ? When and how you should change your bandage (dressing). ? When you should remove your dressing. ? Removing whatever was used to close your excision site.  Check the excision area every day for signs of infection. Watch for: ? Redness, swelling, or pain. ? Fluid, blood, or pus.  For bleeding, apply gentle but firm pressure to the area using a folded towel for 20 minutes.  Avoid high-impact exercise and activities until the stitches (sutures) are removed or the area heals.  Follow instructions from your health care provider about how to minimize scarring. Avoid sun exposure until the area has healed. Scarring should lessen over time.  Keep all follow-up visits as  told by your health care provider. This is important. Contact a health care provider if:  You have a fever.  You have redness, swelling, or pain at the excision site.  You have fluid, blood, or pus coming from the excision site.  You have ongoing bleeding at the excision site.  You have pain that does not improve in 2-3 days after your procedure.  You notice skin irregularities or changes in sensation. This information is not intended to replace advice given to you by your health care provider. Make sure you discuss any questions you have with your health care provider. Document Released: 11/15/2014 Document Revised: 12/07/2015 Document Reviewed: 08/17/2014 Elsevier Interactive Patient Education  2018 [place] Anesthesia, Adult General anesthesia is the use of medicines to make a person "go to sleep" (be unconscious) for a medical procedure. General anesthesia is often recommended when a procedure:  Is long.  Requires you to be still or in an unusual position.  Is major and can cause you to lose blood.  Is impossible to do without general anesthesia.  The medicines used for general anesthesia are called general anesthetics. In addition to making you sleep, the medicines:  Prevent pain.  Control your blood pressure.  Relax your muscles.  Tell a health care provider about:  Any allergies you have.  All medicines you are taking, including vitamins, herbs, eye drops, creams, and over-the-counter medicines.  Any problems you or family members have had with anesthetic medicines.  Types of anesthetics you have had in the past.  Any bleeding disorders you have.  Any surgeries you have had.  Any medical conditions you have.  Any history of heart or lung conditions, such as heart failure, sleep apnea, or chronic obstructive pulmonary disease (COPD).  Whether you are pregnant or may be pregnant.  Whether you use tobacco, alcohol, marijuana, or street  drugs.  Any history of Armed forces logistics/support/administrative officer.  Any history of  depression or anxiety. What are the risks? Generally, this is a safe procedure. However, problems may occur, including:  Allergic reaction to anesthetics.  Lung and heart problems.  Inhaling food or liquids from your stomach into your lungs (aspiration).  Injury to nerves.  Waking up during your procedure and being unable to move (rare).  Extreme agitation or a state of mental confusion (delirium) when you wake up from the anesthetic.  Air in the bloodstream, which can lead to stroke.  These problems are more likely to develop if you are having a major surgery or if you have an advanced medical condition. You can prevent some of these complications by answering all of your health care provider's questions thoroughly and by following all pre-procedure instructions. General anesthesia can cause side effects, including:  Nausea or vomiting  A sore throat from the breathing tube.  Feeling cold or shivery.  Feeling tired, washed out, or achy.  Sleepiness or drowsiness.  Confusion or agitation.  What happens before the procedure? Staying hydrated Follow instructions from your health care provider about hydration, which may include:  Up to 2 hours before the procedure - you may continue to drink clear liquids, such as water, clear fruit juice, black coffee, and plain tea.  Eating and drinking restrictions Follow instructions from your health care provider about eating and drinking, which may include:  8 hours before the procedure - stop eating heavy meals or foods such as meat, fried foods, or fatty foods.  6 hours before the procedure - stop eating light meals or foods, such as toast or cereal.  6 hours before the procedure - stop drinking milk or drinks that contain milk.  2 hours before the procedure - stop drinking clear liquids.  Medicines  Ask your health care provider about: ? Changing or stopping your  regular medicines. This is especially important if you are taking diabetes medicines or blood thinners. ? Taking medicines such as aspirin and ibuprofen. These medicines can thin your blood. Do not take these medicines before your procedure if your health care provider instructs you not to. ? Taking new dietary supplements or medicines. Do not take these during the week before your procedure unless your health care provider approves them.  If you are told to take a medicine or to continue taking a medicine on the day of the procedure, take the medicine with sips of water. General instructions   Ask if you will be going home the same day, the following day, or after a longer hospital stay. ? Plan to have someone take you home. ? Plan to have someone stay with you for the first 24 hours after you leave the hospital or clinic.  For 3-6 weeks before the procedure, try not to use any tobacco products, such as cigarettes, chewing tobacco, and e-cigarettes.  You may brush your teeth on the morning of the procedure, but make sure to spit out the toothpaste. What happens during the procedure?  You will be given anesthetics through a mask and through an IV tube in one of your veins.  You may receive medicine to help you relax (sedative).  As soon as you are asleep, a breathing tube may be used to help you breathe.  An anesthesia specialist will stay with you throughout the procedure. He or she will help keep you comfortable and safe by continuing to give you medicines and adjusting the amount of medicine that you get. He or she will also watch your blood pressure,  pulse, and oxygen levels to make sure that the anesthetics do not cause any problems.  If a breathing tube was used to help you breathe, it will be removed before you wake up. The procedure may vary among health care providers and hospitals. What happens after the procedure?  You will wake up, often slowly, after the procedure is  complete, usually in a recovery area.  Your blood pressure, heart rate, breathing rate, and blood oxygen level will be monitored until the medicines you were given have worn off.  You may be given medicine to help you calm down if you feel anxious or agitated.  If you will be going home the same day, your health care provider may check to make sure you can stand, drink, and urinate.  Your health care providers will treat your pain and side effects before you go home.  Do not drive for 24 hours if you received a sedative.  You may: ? Feel nauseous and vomit. ? Have a sore throat. ? Have mental slowness. ? Feel cold or shivery. ? Feel sleepy. ? Feel tired. ? Feel sore or achy, even in parts of your body where you did not have surgery. This information is not intended to replace advice given to you by your health care provider. Make sure you discuss any questions you have with your health care provider. Document Released: 10/08/2007 Document Revised: 12/12/2015 Document Reviewed: 06/15/2015 Elsevier Interactive Patient Education  2018 [place] Anesthesia, Adult, Care After These instructions provide you with information about caring for yourself after your procedure. Your health care provider may also give you more specific instructions. Your treatment has been planned according to current medical practices, but problems sometimes occur. Call your health care provider if you have any problems or questions after your procedure. What can I expect after the procedure? After the procedure, it is common to have:  Vomiting.  A sore throat.  Mental slowness.  It is common to feel:  Nauseous.  Cold or shivery.  Sleepy.  Tired.  Sore or achy, even in parts of your body where you did not have surgery.  Follow these instructions at home: For at least 24 hours after the procedure:  Do not: ? Participate in activities where you could fall or become  injured. ? Drive. ? Use heavy machinery. ? Drink alcohol. ? Take sleeping pills or medicines that cause drowsiness. ? Make important decisions or sign legal documents. ? Take care of children on your own.  Rest. Eating and drinking  If you vomit, drink water, juice, or soup when you can drink without vomiting.  Drink enough fluid to keep your urine clear or pale yellow.  Make sure you have little or no nausea before eating solid foods.  Follow the diet recommended by your health care provider. General instructions  Have a responsible adult stay with you until you are awake and alert.  Return to your normal activities as told by your health care provider. Ask your health care provider what activities are safe for you.  Take over-the-counter and prescription medicines only as told by your health care provider.  If you smoke, do not smoke without supervision.  Keep all follow-up visits as told by your health care provider. This is important. Contact a health care provider if:  You continue to have nausea or vomiting at home, and medicines are not helpful.  You cannot drink fluids or start eating again.  You cannot urinate after 8-12 hours.  You develop a skin rash.  You have fever.  You have increasing redness at the site of your procedure. Get help right away if:  You have difficulty breathing.  You have chest pain.  You have unexpected bleeding.  You feel that you are having a life-threatening or urgent problem. This information is not intended to replace advice given to you by your health care provider. Make sure you discuss any questions you have with your health care provider. Document Released: 10/07/2000 Document Revised: 12/04/2015 Document Reviewed: 06/15/2015 Elsevier Interactive Patient Education  [employer].

## 2017-03-13 ENCOUNTER — Ambulatory Visit: Payer: 59 | Admitting: Neurology

## 2017-03-18 ENCOUNTER — Encounter (HOSPITAL_COMMUNITY): Payer: Self-pay

## 2017-03-18 ENCOUNTER — Encounter (HOSPITAL_COMMUNITY)
Admission: RE | Admit: 2017-03-18 | Discharge: 2017-03-18 | Disposition: A | Discharge status: Home / Self Care | Payer: 59 | Source: Ambulatory Visit | Attending: Podiatry | Admitting: Podiatry

## 2017-03-18 ENCOUNTER — Ambulatory Visit (HOSPITAL_COMMUNITY)
Admission: RE | Admit: 2017-03-18 | Discharge: 2017-03-18 | Disposition: A | Discharge status: Home / Self Care | Payer: 59 | Source: Ambulatory Visit | Attending: Podiatry | Admitting: Podiatry

## 2017-03-18 ENCOUNTER — Other Ambulatory Visit (HOSPITAL_COMMUNITY): Payer: Self-pay | Admitting: Podiatry

## 2017-03-18 DIAGNOSIS — R2241 Localized swelling, mass and lump, right lower limb: Secondary | ICD-10-CM

## 2017-03-18 DIAGNOSIS — M7989 Other specified soft tissue disorders: Secondary | ICD-10-CM

## 2017-03-18 DIAGNOSIS — M799 Soft tissue disorder, unspecified: Secondary | ICD-10-CM | POA: Diagnosis present

## 2017-03-18 HISTORY — DX: Unspecified mononeuropathy of left lower limb: G57.92

## 2017-03-18 HISTORY — DX: Other complications of anesthesia, initial encounter: T88.59XA

## 2017-03-18 HISTORY — DX: Adverse effect of unspecified anesthetic, initial encounter: T41.45XA

## 2017-03-18 HISTORY — DX: Attention-deficit hyperactivity disorder, unspecified type: F90.9

## 2017-03-18 LAB — CBC WITH DIFFERENTIAL/PLATELET
BASOS PCT: 1 %
Basophils Absolute: 0.1 10*3/uL (ref 0.0–0.1)
EOS ABS: 0.2 10*3/uL (ref 0.0–0.7)
EOS PCT: 2 %
HCT: 40.9 % (ref 36.0–46.0)
HEMOGLOBIN: 13.6 g/dL (ref 12.0–15.0)
Lymphocytes Relative: 27 %
Lymphs Abs: 2.5 10*3/uL (ref 0.7–4.0)
MCH: 31 pg (ref 26.0–34.0)
MCHC: 33.3 g/dL (ref 30.0–36.0)
MCV: 93.2 fL (ref 78.0–100.0)
MONOS PCT: 6 %
Monocytes Absolute: 0.5 10*3/uL (ref 0.1–1.0)
NEUTROS PCT: 64 %
Neutro Abs: 6 10*3/uL (ref 1.7–7.7)
PLATELETS: 251 10*3/uL (ref 150–400)
RBC: 4.39 MIL/uL (ref 3.87–5.11)
RDW: 13.4 % (ref 11.5–15.5)
WBC: 9.2 10*3/uL (ref 4.0–10.5)

## 2017-03-18 LAB — BASIC METABOLIC PANEL
Anion gap: 6 (ref 5–15)
BUN: 11 mg/dL (ref 6–20)
CALCIUM: 9.1 mg/dL (ref 8.9–10.3)
CO2: 26 mmol/L (ref 22–32)
CREATININE: 0.6 mg/dL (ref 0.44–1.00)
Chloride: 104 mmol/L (ref 101–111)
GFR calc non Af Amer: >60 mL/min (ref >60)
Glucose, Bld: 90 mg/dL (ref 65–99)
Potassium: 3.5 mmol/L (ref 3.5–5.1)
SODIUM: 136 mmol/L (ref 135–145)

## 2017-03-18 LAB — TSH: TSH: 3.542 u[IU]/mL (ref 0.350–4.500)

## 2017-03-18 NOTE — Progress Notes (Signed)
   03/18/17 1316  OBSTRUCTIVE SLEEP APNEA  Have you ever been diagnosed with sleep apnea through a sleep study? No  Do you snore loudly (loud enough to be heard through closed doors)?  1  Do you often feel tired, fatigued, or sleepy during the daytime (such as falling asleep during driving or talking to someone)? 1  Has anyone observed you stop breathing during your sleep? 0  Do you have, or are you being treated for high blood pressure? 1  BMI more than 35 kg/m2? 1  Age > 50 (1-yes) 0  Neck circumference greater than:Female 16 inches or larger, Female 17inches or larger? 0  Female Gender (Yes=1) 0  Obstructive Sleep Apnea Score 4  Score 5 or greater  Results sent to PCP

## 2017-03-19 ENCOUNTER — Encounter: Payer: Self-pay | Admitting: Family Medicine

## 2017-03-26 ENCOUNTER — Ambulatory Visit (HOSPITAL_COMMUNITY): Payer: 59 | Admitting: Anesthesiology

## 2017-03-26 ENCOUNTER — Ambulatory Visit (HOSPITAL_COMMUNITY)
Admission: RE | Admit: 2017-03-26 | Discharge: 2017-03-26 | Disposition: A | Discharge status: Home / Self Care | Payer: 59 | Source: Ambulatory Visit | Attending: Podiatry | Admitting: Podiatry

## 2017-03-26 ENCOUNTER — Encounter (HOSPITAL_COMMUNITY)
Admission: RE | Disposition: A | Discharge status: Home / Self Care | Payer: Self-pay | Source: Ambulatory Visit | Attending: Podiatry

## 2017-03-26 ENCOUNTER — Encounter (HOSPITAL_COMMUNITY): Payer: Self-pay | Admitting: *Deleted

## 2017-03-26 DIAGNOSIS — F419 Anxiety disorder, unspecified: Secondary | ICD-10-CM | POA: Insufficient documentation

## 2017-03-26 DIAGNOSIS — R2242 Localized swelling, mass and lump, left lower limb: Secondary | ICD-10-CM | POA: Diagnosis present

## 2017-03-26 DIAGNOSIS — Z79899 Other long term (current) drug therapy: Secondary | ICD-10-CM | POA: Diagnosis not present

## 2017-03-26 DIAGNOSIS — Z79890 Hormone replacement therapy (postmenopausal): Secondary | ICD-10-CM | POA: Diagnosis not present

## 2017-03-26 DIAGNOSIS — M7989 Other specified soft tissue disorders: Secondary | ICD-10-CM

## 2017-03-26 DIAGNOSIS — R2241 Localized swelling, mass and lump, right lower limb: Secondary | ICD-10-CM

## 2017-03-26 DIAGNOSIS — F329 Major depressive disorder, single episode, unspecified: Secondary | ICD-10-CM | POA: Diagnosis not present

## 2017-03-26 DIAGNOSIS — I10 Essential (primary) hypertension: Secondary | ICD-10-CM | POA: Insufficient documentation

## 2017-03-26 DIAGNOSIS — D367 Benign neoplasm of other specified sites: Secondary | ICD-10-CM | POA: Diagnosis not present

## 2017-03-26 DIAGNOSIS — D2372 Other benign neoplasm of skin of left lower limb, including hip: Secondary | ICD-10-CM | POA: Insufficient documentation

## 2017-03-26 DIAGNOSIS — F17200 Nicotine dependence, unspecified, uncomplicated: Secondary | ICD-10-CM | POA: Diagnosis not present

## 2017-03-26 DIAGNOSIS — Z79891 Long term (current) use of opiate analgesic: Secondary | ICD-10-CM | POA: Insufficient documentation

## 2017-03-26 DIAGNOSIS — Z9889 Other specified postprocedural states: Secondary | ICD-10-CM

## 2017-03-26 HISTORY — PX: MASS EXCISION: SHX2000

## 2017-03-26 SURGERY — EXCISION MASS
Anesthesia: Monitor Anesthesia Care | Site: Second Toe | Laterality: Right

## 2017-03-26 MED ORDER — LIDOCAINE HCL (PF) 1 % IJ SOLN
INTRAMUSCULAR | Status: AC
  Filled 2017-03-26: qty 30

## 2017-03-26 MED ORDER — DEXAMETHASONE SODIUM PHOSPHATE 4 MG/ML IJ SOLN
4.0000 mg | INTRAMUSCULAR | Status: AC
  Administered 2017-03-26: 4 mg via INTRAVENOUS

## 2017-03-26 MED ORDER — BUPIVACAINE HCL (PF) 0.5 % IJ SOLN
INTRAMUSCULAR | Status: AC
  Filled 2017-03-26: qty 30

## 2017-03-26 MED ORDER — FENTANYL CITRATE (PF) 100 MCG/2ML IJ SOLN
25.0000 ug | Freq: Once | INTRAMUSCULAR | Status: AC
  Administered 2017-03-26: 25 ug via INTRAVENOUS

## 2017-03-26 MED ORDER — MIDAZOLAM HCL 2 MG/2ML IJ SOLN
1.0000 mg | INTRAMUSCULAR | Status: AC
Start: 2017-03-26 — End: 2017-03-26
  Administered 2017-03-26: 2 mg via INTRAVENOUS

## 2017-03-26 MED ORDER — MIDAZOLAM HCL 5 MG/5ML IJ SOLN
INTRAMUSCULAR | Status: DC | PRN
  Administered 2017-03-26: 2 mg via INTRAVENOUS

## 2017-03-26 MED ORDER — BUPIVACAINE HCL (PF) 0.5 % IJ SOLN
INTRAMUSCULAR | Status: DC | PRN
  Administered 2017-03-26: 10 mL
  Administered 2017-03-26: 5 mL

## 2017-03-26 MED ORDER — FENTANYL CITRATE (PF) 100 MCG/2ML IJ SOLN
INTRAMUSCULAR | Status: DC | PRN
  Administered 2017-03-26: 50 ug via INTRAVENOUS
  Administered 2017-03-26 (×2): 25 ug via INTRAVENOUS

## 2017-03-26 MED ORDER — PROPOFOL 10 MG/ML IV BOLUS
INTRAVENOUS | Status: AC
  Filled 2017-03-26: qty 20

## 2017-03-26 MED ORDER — SODIUM CHLORIDE 0.9% FLUSH
INTRAVENOUS | Status: AC
  Filled 2017-03-26: qty 10

## 2017-03-26 MED ORDER — MIDAZOLAM HCL 2 MG/2ML IJ SOLN
INTRAMUSCULAR | Status: AC
  Filled 2017-03-26: qty 2

## 2017-03-26 MED ORDER — PROMETHAZINE HCL 25 MG/ML IJ SOLN
INTRAMUSCULAR | Status: AC
  Filled 2017-03-26: qty 1

## 2017-03-26 MED ORDER — PROPOFOL 500 MG/50ML IV EMUL
INTRAVENOUS | Status: DC | PRN
  Administered 2017-03-26: 10:00:00 via INTRAVENOUS
  Administered 2017-03-26: 100 ug/kg/min via INTRAVENOUS

## 2017-03-26 MED ORDER — CHLORHEXIDINE GLUCONATE CLOTH 2 % EX PADS: 6.0000 | MEDICATED_PAD | Freq: Once | CUTANEOUS | Status: DC

## 2017-03-26 MED ORDER — CEFAZOLIN SODIUM-DEXTROSE 2-4 GM/100ML-% IV SOLN
2.0000 g | INTRAVENOUS | Status: DC
Start: 2017-03-26 — End: 2017-03-26

## 2017-03-26 MED ORDER — HYDROMORPHONE HCL 1 MG/ML IJ SOLN
INTRAMUSCULAR | Status: AC
  Filled 2017-03-26: qty 1

## 2017-03-26 MED ORDER — PROMETHAZINE HCL 25 MG/ML IJ SOLN: 12.5000 mg | Freq: Once | INTRAMUSCULAR | Status: DC

## 2017-03-26 MED ORDER — PROMETHAZINE HCL 25 MG/ML IJ SOLN
12.5000 mg | Freq: Once | INTRAMUSCULAR | Status: AC
  Administered 2017-03-26: 12.5 mg via INTRAVENOUS

## 2017-03-26 MED ORDER — SODIUM CHLORIDE 0.9 % IR SOLN
Status: DC | PRN
  Administered 2017-03-26: 1000 mL

## 2017-03-26 MED ORDER — LACTATED RINGERS IV SOLN
INTRAVENOUS | Status: DC
  Administered 2017-03-26: 08:00:00 via INTRAVENOUS

## 2017-03-26 MED ORDER — FENTANYL CITRATE (PF) 100 MCG/2ML IJ SOLN
25.0000 ug | INTRAMUSCULAR | Status: DC | PRN
  Administered 2017-03-26 (×2): 50 ug via INTRAVENOUS
  Filled 2017-03-26: qty 2

## 2017-03-26 MED ORDER — FENTANYL CITRATE (PF) 100 MCG/2ML IJ SOLN
INTRAMUSCULAR | Status: AC
  Filled 2017-03-26: qty 2

## 2017-03-26 MED ORDER — CEFAZOLIN SODIUM-DEXTROSE 2-4 GM/100ML-% IV SOLN
2.0000 g | Freq: Once | INTRAVENOUS | Status: AC
  Administered 2017-03-26: 2 g via INTRAVENOUS

## 2017-03-26 MED ORDER — HYDROMORPHONE HCL 1 MG/ML IJ SOLN
0.5000 mg | INTRAMUSCULAR | Status: DC | PRN
  Administered 2017-03-26: 0.5 mg via INTRAVENOUS

## 2017-03-26 SURGICAL SUPPLY — 43 items
BAG HAMPER (MISCELLANEOUS) ×8 IMPLANT
BANDAGE ELASTIC 4 LF NS (GAUZE/BANDAGES/DRESSINGS) ×8 IMPLANT
BANDAGE ELASTIC 4 VELCRO ST LF (GAUZE/BANDAGES/DRESSINGS) ×8 IMPLANT
BANDAGE ESMARK 4X12 BL STRL LF (DISPOSABLE) ×4 IMPLANT
BENZOIN TINCTURE PRP APPL 2/3 (GAUZE/BANDAGES/DRESSINGS) IMPLANT
BLADE SURG 15 STRL LF DISP TIS (BLADE) ×8 IMPLANT
BLADE SURG 15 STRL SS (BLADE) ×8
BNDG CONFORM 2 STRL LF (GAUZE/BANDAGES/DRESSINGS) ×8 IMPLANT
BNDG ESMARK 4X12 BLUE STRL LF (DISPOSABLE) ×8
BNDG GAUZE ELAST 4 BULKY (GAUZE/BANDAGES/DRESSINGS) ×8 IMPLANT
BOOT STEPPER DURA MED (SOFTGOODS) ×4 IMPLANT
CHLORAPREP W/TINT 26ML (MISCELLANEOUS) ×8 IMPLANT
CLOSURE WOUND 1/2 X4 (GAUZE/BANDAGES/DRESSINGS)
CLOTH BEACON ORANGE TIMEOUT ST (SAFETY) ×4 IMPLANT
COVER LIGHT HANDLE STERIS (MISCELLANEOUS) ×16 IMPLANT
CUFF TOURNIQUET SINGLE 18IN (TOURNIQUET CUFF) ×8 IMPLANT
DECANTER SPIKE VIAL GLASS SM (MISCELLANEOUS) ×8 IMPLANT
DRSG ADAPTIC 3X8 NADH LF (GAUZE/BANDAGES/DRESSINGS) ×8 IMPLANT
ELECT REM PT RETURN 9FT ADLT (ELECTROSURGICAL) ×4
ELECTRODE REM PT RTRN 9FT ADLT (ELECTROSURGICAL) ×2 IMPLANT
GAUZE SPONGE 4X4 12PLY STRL (GAUZE/BANDAGES/DRESSINGS) ×8 IMPLANT
GLOVE BIO SURGEON STRL SZ7 (GLOVE) ×8 IMPLANT
GLOVE BIO SURGEON STRL SZ7.5 (GLOVE) ×8 IMPLANT
GLOVE BIOGEL PI IND STRL 7.0 (GLOVE) ×6 IMPLANT
GLOVE BIOGEL PI INDICATOR 7.0 (GLOVE) ×6
GLOVE ECLIPSE 7.0 STRL STRAW (GLOVE) ×8 IMPLANT
GOWN STRL REUS W/TWL LRG LVL3 (GOWN DISPOSABLE) ×16 IMPLANT
KIT ROOM TURNOVER AP CYSTO (KITS) ×4 IMPLANT
MANIFOLD NEPTUNE II (INSTRUMENTS) ×4 IMPLANT
NEEDLE HYPO 27GX1-1/4 (NEEDLE) ×8 IMPLANT
NS IRRIG 1000ML POUR BTL (IV SOLUTION) ×4 IMPLANT
PACK BASIC LIMB (CUSTOM PROCEDURE TRAY) ×4 IMPLANT
PAD ABD 5X9 TENDERSORB (GAUZE/BANDAGES/DRESSINGS) ×8 IMPLANT
PAD ARMBOARD 7.5X6 YLW CONV (MISCELLANEOUS) ×8 IMPLANT
SET BASIN LINEN APH (SET/KITS/TRAYS/PACK) ×4 IMPLANT
SPONGE LAP 18X18 X RAY DECT (DISPOSABLE) ×4 IMPLANT
SPONGE LAP 4X18 X RAY DECT (DISPOSABLE) ×4 IMPLANT
STRIP CLOSURE SKIN 1/2X4 (GAUZE/BANDAGES/DRESSINGS) IMPLANT
SUT ETHILON 3 0 PS 1 (SUTURE) ×8 IMPLANT
SUT PROLENE 4 0 PS 2 18 (SUTURE) IMPLANT
SUT VIC AB 2-0 CT2 27 (SUTURE) IMPLANT
SUT VIC AB 4-0 PS2 27 (SUTURE) IMPLANT
SYR CONTROL 10ML LL (SYRINGE) ×16 IMPLANT

## 2017-03-26 NOTE — Progress Notes (Signed)
SCDs on bilateral legs per order. Patient cleansed her feet with CHG cloths.

## 2017-03-26 NOTE — H&P (Signed)
HISTORY AND PHYSICAL INTERVAL NOTE:  03/26/2017  8:43 AM  Laurie Hutchinson  has presented today for surgery, with the diagnosis of soft tissue mass left foot, soft tissue mass 2nd toe right foot.  The various methods of treatment have been discussed with the patient.  No guarantees were given.  After consideration of risks, benefits and other options for treatment, the patient has consented to surgery.  I have reviewed the patients' chart and labs.    Patient Vitals for the past 24 hrs:  BP Temp Temp src Pulse Resp SpO2 Height Weight  03/26/17 0807 132/89 98.1 F (36.7 C) Oral 77 18 95 % - -  03/26/17 0746 - - - - - - 5\' 2"  (1.575 m) 203 lb (92.1 kg)    A history and physical examination was performed in my office.  The patient was reexamined.  There have been no changes to this history and physical examination.  Laurie Hutchinson, DPM

## 2017-03-26 NOTE — Transfer of Care (Signed)
Immediate Anesthesia Transfer of Care Note  Patient: Laurie Hutchinson  Procedure(s) Performed: Procedure(s): EXCISION SOFT TISSUE MASS LEFT FOOT (Left) EXCISION SOFT TISSUE MASS 2ND TOE RIGHT FOOT (Right)  Patient Location: PACU  Anesthesia Type:MAC  Level of Consciousness: awake and patient cooperative  Airway & Oxygen Therapy: Patient Spontanous Breathing and Patient connected to face mask oxygen  Post-op Assessment: Report given to RN, Post -op Vital signs reviewed and stable and Patient moving all extremities  Post vital signs: Reviewed and stable  Last Vitals:  Vitals:   03/26/17 0905 03/26/17 0910  BP: 126/79 134/88  Pulse:    Resp: 20 15  Temp:    SpO2: 98% 98%    Last Pain:  Vitals:   03/26/17 0807  TempSrc: Oral  PainSc:       Patients Stated Pain Goal: 3 (10/62/69 4854)  Complications: No apparent anesthesia complications

## 2017-03-26 NOTE — Anesthesia Preprocedure Evaluation (Addendum)
Anesthesia Evaluation  Patient identified by MRN, date of birth, ID band Patient awake    Reviewed: Allergy & Precautions, NPO status , Patient's Chart, lab work & pertinent test results  History of Anesthesia Complications (+) history of anesthetic complications (unclear history of "heart stopping" during tubal ligation. records unavailible)  Airway Mallampati: II  TM Distance: >3 FB Neck ROM: Full    Dental no notable dental hx.    Pulmonary neg pulmonary ROS, Current Smoker,    Pulmonary exam normal breath sounds clear to auscultation       Cardiovascular hypertension, Pt. on medications negative cardio ROS Normal cardiovascular exam Rhythm:Regular Rate:Normal     Neuro/Psych  Headaches, PSYCHIATRIC DISORDERS Anxiety Depression  Neuromuscular disease negative neurological ROS  negative psych ROS   GI/Hepatic negative GI ROS, Neg liver ROS, GERD  ,  Endo/Other  negative endocrine ROS  Renal/GU negative Renal ROS  negative genitourinary   Musculoskeletal negative musculoskeletal ROS (+)   Abdominal   Peds negative pediatric ROS (+)  Hematology negative hematology ROS (+)   Anesthesia Other Findings   Reproductive/Obstetrics negative OB ROS                             Anesthesia Physical Anesthesia Plan  ASA: II  Anesthesia Plan: MAC   Post-op Pain Management:    Induction: Intravenous  PONV Risk Score and Plan:   Airway Management Planned: Simple Face Mask  Additional Equipment:   Intra-op Plan:   Post-operative Plan:   Informed Consent: I have reviewed the patients History and Physical, chart, labs and discussed the procedure including the risks, benefits and alternatives for the proposed anesthesia with the patient or authorized representative who has indicated his/her understanding and acceptance.     Plan Discussed with:   Anesthesia Plan Comments:          Anesthesia Quick Evaluation

## 2017-03-26 NOTE — Brief Op Note (Signed)
BRIEF OPERATIVE NOTE  DATE OF PROCEDURE 03/26/2017  SURGEON Marcheta Grammes, DPM  ASSISTANT SURGEON Jilda Panda, DPM  OR STAFF Circulator: Cox, Gershon Mussel, RN Scrub Person: Towanda Malkin, RN Circulator Assistant: Cipriano Bunker, RN   PREOPERATIVE DIAGNOSIS 1.  Soft tissue mass, left foot 2.  Soft tissue mass 2nd toe, right foot  POSTOPERATIVE DIAGNOSIS Same  PROCEDURE 1.  Excision of soft tissue mass, left foot 2.  Excision of soft tissue mass, right foot  ANESTHESIA Monitor Anesthesia Care   HEMOSTASIS Pneumatic ankle tourniquet set at 250 mmHg  ESTIMATED BLOOD LOSS Minimal (<5 cc)  MATERIALS USED None  INJECTABLES 0.5% Marcaine plain  PATHOLOGY 1.  Soft tissue mass, left foot 2.  Soft tissue mass of 2nd toe, right foot  COMPLICATIONS None

## 2017-03-26 NOTE — Op Note (Signed)
OPERATIVE NOTE  DATE OF PROCEDURE 03/26/2017  SURGEON Marcheta Grammes, DPM  ASSISTANT SURGEON Jilda Panda, DPM  OR STAFF Circulator: Cox, Gershon Mussel, RN Scrub Person: Towanda Malkin, RN Circulator Assistant: Cipriano Bunker, RN   PREOPERATIVE DIAGNOSIS 1.  Soft tissue mass, left foot 2.  Soft tissue mass 2nd toe, right foot  POSTOPERATIVE DIAGNOSIS Same  PROCEDURE 1.  Excision of soft tissue mass, left foot 2.  Excision of soft tissue mass of the 2nd toe, right foot  ANESTHESIA Monitor Anesthesia Care   HEMOSTASIS Pneumatic ankle tourniquet set at 250 mmHg  ESTIMATED BLOOD LOSS Minimal (<5 cc)  MATERIALS USED None  INJECTABLES 0.5% Marcaine plain  PATHOLOGY 1.  Soft tissue mass, left foot 2.  Soft tissue mass of 2nd toe, right foot  COMPLICATIONS None  INDICATIONS:  Chronic, painful mass of the left foot and right second toe.  DESCRIPTION OF THE PROCEDURE:  The patient was brought to the operating room and placed on the operative table in the supine position.  A pneumatic ankle tourniquet was to the left lower extremity.  A pneumatic ankle tourniquet was applied to the right lower extremity.  Following sedation, the surgical sites were anesthetized with 0.5% Marcaine plain.  A total of 20 cc was injected.  Both feet were prepped, scrubbed and draped in the usual sterile technique.  The left foot was elevated, exsanguinated and the pneumatic ankle tourniquet inflated to 250 mmHg.  Attention was directed to the soft tissue mass along the plantar medial aspect of the left foot.  The soft tissue mass measured 1.2 cm in diameter.  2 converging semielliptical incisions were made in a transverse fashion encompassing the mass in its entirety.  The length of the incisions measured 4.0.  Soft tissue mass was was freed of all soft tissue attachments, passed from the operative field and sent to pathology for evaluation.  Underlying subcutaneous tissue appeared normal.   The wound margins were undermined to allow for closure of the surgical wound.  The surgical wound was irrigated with copious amounts of sterile irrigant.  The subcutaneous structures were reapproximated using 4-0 Vicryl.  The skin was reapproximated using 3-0 nylon.  A sterile compressive dressing was applied to the left foot.  The pneumatic ankle tourniquet was deflated and a prompt hyperemic response was noted to all digits of the left foot.  Attention was directed to the soft tissue mass along the distal aspect of the right second toe.  The soft tissue mass measured 0.5 cm in diameter.  2 converging semielliptical incisions were made in a transverse fashion encompassing the mass in its entirety.  The mass was freed of all soft tissue attachments.  The mass was sent to pathology for evaluation.  Underlying subcutaneous tissue appeared normal.  The wound margins were undermined to allow for closure of the surgical wound.  The surgical wound was irrigated with copious amounts of sterile irrigant.  The skin was reapproximated using 3-0 nylon.  A sterile compressive dressing was applied to the right foot.  The pneumatic ankle tourniquet was deflated and a prompt hyperemic response was noted to all digits of the right foot.  The patient was transferred from the operating room to the post anesthesia care unit with vital signs stable.

## 2017-03-26 NOTE — Discharge Instructions (Signed)
These instructions will give you an idea of what to expect after surgery and how to manage issues that may arise before your first post op office visit.  Pain Management Pain is best managed by staying ahead of it. If pain gets out of control, it is difficult to get it back under control. Local anesthesia that lasts 6-8 hours is used to numb the foot and decrease pain.  For the best pain control, take the pain medication every 4 hours for the first 2 days post op. On the third day pain medication can be taken as needed.   Post Op Nausea Nausea is common after surgery, so it is managed proactively.  If prescribed, use the prescribed nausea medication regularly for the first 2 days post op.  Bandages Do not worry if there is blood on the bandage. What looks like a lot of blood on the bandage is actually a small amount. Blood on the dressing spreads out as it is absorbed by the gauze, the same way a drop of water spreads out on a paper towel.  If the bandages feel wet or dry, stiff and uncomfortable, call the office during office hours and we will schedule a time for you to have the bandage changed.  Unless you are specifically told otherwise, we will do the first bandage change in the office.  Keep your bandages dry. If the bandage becomes wet or soiled, notify the office and we will schedule a time to change the bandage.  Activity It is best to spend most of the first 2 days after surgery lying down with the foot elevated above the level of your heart. You may put weight on your left heel while wearing the CAM Walker (black boot). You may put weight on your right foot while wearing the surgical shoe. You may only get up to go to the restroom.  Driving Do not drive until you are able to respond in an emergency (i.e. slam on the brakes). This usually occurs after the bone has healed - 6 to 8 weeks.  Call the Office If you have a fever over 101F.  If you have increasing pain after the  initial post op pain has settled down.  If you have increasing redness, swelling, or drainage.  If you have any questions or concerns.  You may reach the on call provider by calling [phone].   PATIENT INSTRUCTIONS POST-ANESTHESIA  IMMEDIATELY FOLLOWING SURGERY:  Do not drive or operate machinery for the first twenty four hours after surgery.  Do not make any important decisions for twenty four hours after surgery or while taking narcotic pain medications or sedatives.  If you develop intractable nausea and vomiting or a severe headache please notify your doctor immediately.  FOLLOW-UP:  Please make an appointment with your surgeon as instructed. You do not need to follow up with anesthesia unless specifically instructed to do so.  WOUND CARE INSTRUCTIONS (if applicable):  Keep a dry clean dressing on the anesthesia/puncture wound site if there is drainage.  Once the wound has quit draining you may leave it open to air.  Generally you should leave the bandage intact for twenty four hours unless there is drainage.  If the epidural site drains for more than 36-48 hours please call the anesthesia department.  QUESTIONS?:  Please feel free to call your physician or the hospital operator if you have any questions, and they will be happy to assist you.

## 2017-03-26 NOTE — Anesthesia Postprocedure Evaluation (Signed)
Anesthesia Post Note  Patient: Laurie Hutchinson  Procedure(s) Performed: Procedure(s) (LRB): EXCISION SOFT TISSUE MASS LEFT FOOT (Left) EXCISION SOFT TISSUE MASS 2ND TOE RIGHT FOOT (Right)  Patient location during evaluation: PACU Anesthesia Type: MAC Level of consciousness: awake Pain management: pain level controlled Vital Signs Assessment: post-procedure vital signs reviewed and stable Respiratory status: spontaneous breathing, nonlabored ventilation and respiratory function stable Cardiovascular status: blood pressure returned to baseline Postop Assessment: no signs of nausea or vomiting Anesthetic complications: no     Last Vitals:  Vitals:   03/26/17 0915 03/26/17 1035  BP:  (!) 126/94  Pulse:  73  Resp:  13  Temp:  37 C  SpO2: 97% 100%    Last Pain:  Vitals:   03/26/17 0807  TempSrc: Oral  PainSc:                  Laurie Hutchinson

## 2017-03-27 ENCOUNTER — Encounter (HOSPITAL_COMMUNITY): Payer: Self-pay | Admitting: Podiatry

## 2017-04-02 NOTE — Progress Notes (Deleted)
Patient ID: Laurie Hutchinson, female    DOB: 1980/08/23, 36 y.o.   MRN: [id number]  No chief complaint on file.   Allergies Other; Erythromycin; Neosporin [neomycin-bacitracin zn-polymyx]; and Adhesive [tape]  Subjective:   Laurie Hutchinson is a 36 y.o. female who presents to [facility] today.  HPI HPI  Past Medical History:  Diagnosis Date  . ADD (attention deficit disorder)   . ADHD   . Anemia   . Cancer (HCC)    cervical cancer  . Complication of anesthesia    unknown druf 2010 during surgery caused anaphylaxis. [facility] and were unable to obtain records form 2010 surgery. Did fine with 2018 TAH.  . Depression   . Deviated nasal septum   . GERD (gastroesophageal reflux disease)   . Heart murmur    had leaky mitral valve as child and also had rheumatic fever.  Marland Kitchen Heart palpitations   . Hypertension    with pregnancy  . Hypoglycemia   . Left leg pain   . Migraine headache   . 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  . Panic attack   . Pneumonia   . Rheumatic fever     Past Surgical History:  Procedure Laterality Date  . ABDOMINAL HYSTERECTOMY    . COLPOSCOPY    . CYSTOSCOPY N/A 10/09/2016   Procedure: CYSTOSCOPY;  Surgeon: Sanjuana Kava, MD;  Location: [facility];  Service: Gynecology;  Laterality: N/A;  . DILATION AND CURETTAGE OF UTERUS    . MASS EXCISION Left 03/26/2017   Procedure: EXCISION SOFT TISSUE MASS LEFT FOOT;  Surgeon: Caprice Beaver, DPM;  Location: [facility];  Service: Podiatry;  Laterality: Left;  Marland Kitchen MASS EXCISION Right 03/26/2017   Procedure: EXCISION SOFT TISSUE MASS 2ND TOE RIGHT FOOT;  Surgeon: Caprice Beaver, DPM;  Location: [facility];  Service: Podiatry;  Laterality: Right;  . NASAL SEPTUM SURGERY    . TOTAL LAPAROSCOPIC HYSTERECTOMY WITH SALPINGECTOMY Bilateral 10/09/2016   Procedure: HYSTERECTOMY TOTAL LAPAROSCOPIC WITH SALPINGECTOMY;  Surgeon: Sanjuana Kava, MD;   Location: [facility];  Service: Gynecology;  Laterality: Bilateral;  . TUBAL LIGATION      Family History  Problem Relation Age of Onset  . Hypertension Mother   . Thyroid disease Mother   . Heart disease Father   . Heart attack Maternal Grandmother   . Heart attack Maternal Grandfather   . Heart attack Paternal Grandmother   . Heart attack Paternal Grandfather   . Alcohol abuse Brother   . ADD / ADHD Daughter   . Strabismus Daughter   . Migraines Daughter   . Alcohol abuse Brother      Social History   Social History  . Marital status: Single    Spouse name: N/A  . Number of children: 3  . Years of education: Associates   Occupational History  . Property Manager    Social History Main Topics  . Smoking status: Heavy Tobacco Smoker    Packs/day: 0.50    Years: 20.00    Types: Cigarettes  . Smokeless tobacco: Never Used  . Alcohol use Yes     Comment: social  . Drug use: No  . Sexual activity: Yes    Birth control/ protection: None   Other Topics Concern  . Not on file   Social History Narrative   Lives at home with daughter.   Right-handed.   2-3 cups caffeine per day.  Engaged.    Currently not able to work b/c of leg pain.     Review of Systems   Objective:   LMP 10/04/2016 (Exact Date)   Physical Exam   Assessment and Plan   There are no diagnoses linked to this encounter.   No Follow-up on file. Amado Coe, [place] 04/02/2017

## 2017-04-09 ENCOUNTER — Ambulatory Visit: Payer: 59 | Admitting: Family Medicine

## 2017-04-10 ENCOUNTER — Ambulatory Visit (INDEPENDENT_AMBULATORY_CARE_PROVIDER_SITE_OTHER): Payer: 59 | Admitting: Otolaryngology

## 2017-04-18 ENCOUNTER — Ambulatory Visit: Payer: 59 | Admitting: Family Medicine

## 2017-04-30 ENCOUNTER — Ambulatory Visit: Payer: 59 | Admitting: Family Medicine

## 2017-05-01 ENCOUNTER — Other Ambulatory Visit: Payer: Self-pay | Admitting: Family Medicine

## 2017-05-07 ENCOUNTER — Telehealth: Payer: Self-pay | Admitting: Family Medicine

## 2017-05-07 NOTE — Telephone Encounter (Signed)
Please advise patient that I will fill this time, but she cancelled an appointment and she did not show up for an appointment this week. She has to have OV before anymore refills. [name], MD

## 2017-05-07 NOTE — Telephone Encounter (Signed)
RETURNED PATIENTS CALL, HER NAME WAS NOT ON HER VOICEMAIL SO I DID NOT LEAVE A MSG.

## 2017-05-07 NOTE — Telephone Encounter (Signed)
Left message

## 2017-05-13 ENCOUNTER — Telehealth: Payer: Self-pay | Admitting: *Deleted

## 2017-05-13 ENCOUNTER — Ambulatory Visit: Payer: Self-pay | Admitting: Neurology

## 2017-05-13 NOTE — Telephone Encounter (Signed)
No showed follow up appointment. 

## 2017-05-14 ENCOUNTER — Encounter: Payer: Self-pay | Admitting: Family Medicine

## 2017-05-14 ENCOUNTER — Encounter: Payer: Self-pay | Admitting: Neurology

## 2017-05-14 ENCOUNTER — Ambulatory Visit (INDEPENDENT_AMBULATORY_CARE_PROVIDER_SITE_OTHER): Payer: 59 | Admitting: Family Medicine

## 2017-05-14 VITALS — BP 120/60 | HR 92 | Temp 97.9°F | Resp 16 | Ht 62.0 in | Wt 207.5 lb

## 2017-05-14 DIAGNOSIS — J209 Acute bronchitis, unspecified: Secondary | ICD-10-CM | POA: Diagnosis not present

## 2017-05-14 DIAGNOSIS — Z79890 Hormone replacement therapy (postmenopausal): Secondary | ICD-10-CM

## 2017-05-14 DIAGNOSIS — R059 Cough, unspecified: Secondary | ICD-10-CM

## 2017-05-14 DIAGNOSIS — J3089 Other allergic rhinitis: Secondary | ICD-10-CM

## 2017-05-14 DIAGNOSIS — Z720 Tobacco use: Secondary | ICD-10-CM | POA: Diagnosis not present

## 2017-05-14 DIAGNOSIS — R05 Cough: Secondary | ICD-10-CM | POA: Diagnosis not present

## 2017-05-14 MED ORDER — AMOXICILLIN-POT CLAVULANATE 875-125 MG PO TABS: 1.0000 | ORAL_TABLET | Freq: Two times a day (BID) | ORAL | 0 refills | Status: DC

## 2017-05-14 MED ORDER — CETIRIZINE HCL 10 MG PO TABS: 10.0000 mg | ORAL_TABLET | Freq: Every day | ORAL | 11 refills | Status: DC

## 2017-05-14 NOTE — Progress Notes (Signed)
Patient ID: Laurie Hutchinson, female    DOB: 28-Jun-1981, 36 y.o.   MRN: [id number]  Chief Complaint  Patient presents with  . Follow-up  . Depression    Allergies Other; Erythromycin; Neosporin [neomycin-bacitracin zn-polymyx]; and Adhesive [tape]  Subjective:   Laurie Hutchinson is a 36 y.o. female who presents to [facility] today.  HPI HPI  Past Medical History:  Diagnosis Date  . ADD (attention deficit disorder)   . ADHD   . Anemia   . Cancer (HCC)    cervical cancer  . Complication of anesthesia    unknown druf 2010 during surgery caused anaphylaxis. [facility] and were unable to obtain records form 2010 surgery. Did fine with 2018 TAH.  . Depression   . Deviated nasal septum   . GERD (gastroesophageal reflux disease)   . Heart murmur    had leaky mitral valve as child and also had rheumatic fever.  Marland Kitchen Heart palpitations   . Hypertension    with pregnancy  . Hypoglycemia   . Left leg pain   . Migraine headache   . 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  . Panic attack   . Pneumonia   . Rheumatic fever     Past Surgical History:  Procedure Laterality Date  . ABDOMINAL HYSTERECTOMY    . COLPOSCOPY    . CYSTOSCOPY N/A 10/09/2016   Procedure: CYSTOSCOPY;  Surgeon: Sanjuana Kava, MD;  Location: [facility];  Service: Gynecology;  Laterality: N/A;  . DILATION AND CURETTAGE OF UTERUS    . MASS EXCISION Left 03/26/2017   Procedure: EXCISION SOFT TISSUE MASS LEFT FOOT;  Surgeon: Caprice Beaver, DPM;  Location: [facility];  Service: Podiatry;  Laterality: Left;  Marland Kitchen MASS EXCISION Right 03/26/2017   Procedure: EXCISION SOFT TISSUE MASS 2ND TOE RIGHT FOOT;  Surgeon: Caprice Beaver, DPM;  Location: [facility];  Service: Podiatry;  Laterality: Right;  . NASAL SEPTUM SURGERY    . TOTAL LAPAROSCOPIC HYSTERECTOMY WITH SALPINGECTOMY Bilateral 10/09/2016   Procedure: HYSTERECTOMY TOTAL LAPAROSCOPIC WITH  SALPINGECTOMY;  Surgeon: Sanjuana Kava, MD;  Location: [facility];  Service: Gynecology;  Laterality: Bilateral;  . TUBAL LIGATION      Family History  Problem Relation Age of Onset  . Hypertension Mother   . Thyroid disease Mother   . Heart disease Father   . Heart attack Maternal Grandmother   . Heart attack Maternal Grandfather   . Heart attack Paternal Grandmother   . Heart attack Paternal Grandfather   . Alcohol abuse Brother   . ADD / ADHD Daughter   . Strabismus Daughter   . Migraines Daughter   . Alcohol abuse Brother      Social History   Social History  . Marital status: Single    Spouse name: N/A  . Number of children: 3  . Years of education: Associates   Occupational History  . Property Manager    Social History Main Topics  . Smoking status: Heavy Tobacco Smoker    Packs/day: 0.50    Years: 20.00    Types: Cigarettes  . Smokeless tobacco: Never Used  . Alcohol use Yes     Comment: social  . Drug use: No  . Sexual activity: Yes    Birth control/ protection: None   Other Topics Concern  . None   Social History Narrative   Lives at home with daughter.   Right-handed.   2-3  cups caffeine per day.   Engaged.    Currently not able to work b/c of leg pain.     Review of Systems   Objective:   BP 120/60 (BP Location: Left Arm, Patient Position: Sitting, Cuff Size: Normal)   Pulse 92   Temp 97.9 F (36.6 C) (Other (Comment))   Resp 16   Ht 5\' 2"  (1.575 m)   Wt 207 lb 8 oz (94.1 kg)   LMP 10/04/2016 (Exact Date)   SpO2 96%   BMI 37.95 kg/m   Physical Exam   Assessment and Plan   There are no diagnoses linked to this encounter.   No Follow-up on file. Merceda Elks, LPN [id number]

## 2017-05-14 NOTE — Progress Notes (Signed)
Patient ID: Laurie Hutchinson, female    DOB: 1980/08/04, 36 y.o.   MRN: [id number]  Chief Complaint  Patient presents with  . Follow-up  . Depression    Allergies Other; Erythromycin; Neosporin [neomycin-bacitracin zn-polymyx]; and Adhesive [tape]  Subjective:   Laurie Hutchinson is a 36 y.o. female who presents to [facility] today.  HPI Patient presents today for follow-up. Since she was last seen she did have surgery on her foot. She reports she is somewhat discouraged because she has gained 5 pounds since she was last here. However she understands that she has not been able to be very active due to her surgery. She is trying to eat healthy and lose weight. Increased the Effexor XR as was directed at her last visit. She reports that she can tell her mood has improved. She does not feel as down or sad. She reports she still needs to take the medication every day or she does not feel like getting up out of bed. Reports she has not been as emotional. Is starting to want to get out and do things for fun. Reports her boyfriend feels like her mood has improved but he told her yesterday she still has more room for improvement. Reports her appetite is good. Energy level is improving. Denies any side effects with medication. Denies any suicidal or homicidal ideation. Reports that she does not feel the medication is helped with her hot flashes at all.  Patient reports that she has had a cough for 2-3 months. She reports she had at the last time she was here but she did not mention it. Reports that she coughs throughout the day. Reports that prior to this timeframe she did not have a cough. She does have a history of past and current tobacco use. She denies any shortness of breath. Reports her cough is productive but she swallows the sputum and so she does not know what color it is. She reports that she thought the cough was probably just due to some allergies. Reports she has  lots of nasal congestion also. Denies any weight loss, hemoptysis, or shortness of breath. Denies any reflux symptoms. Reports she has never been told she had asthma. Reports she has had a history of allergies in the past but is not currently taking anything. She reports that she can feel drainage down the back of her throat. Denies any fever or chills. Is not interested in quitting smoking at this time and does not believe that she smokes and off to where it could affect her health.  Reports she has been using the hormone replacement therapy pills and it is helping with her hot flashes. Reports she takes one fourth pill in the morning, one fourth pill in the afternoon and half pill in the evening. She reports that she still does get hot flashes but feels this is much more manageable. Denies any vaginal dryness.   Depression         Associated symptoms include no appetite change.   Past Medical History:  Diagnosis Date  . ADD (attention deficit disorder)   . ADHD   . Anemia   . Cancer (HCC)    cervical cancer  . Complication of anesthesia    unknown druf 2010 during surgery caused anaphylaxis. [facility] and were unable to obtain records form 2010 surgery. Did fine with 2018 TAH.  . Depression   . Deviated nasal septum   . GERD (gastroesophageal  reflux disease)   . Heart murmur    had leaky mitral valve as child and also had rheumatic fever.  Marland Kitchen Heart palpitations   . Hypertension    with pregnancy  . Hypoglycemia   . Left leg pain   . Migraine headache   . 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  . Panic attack   . Pneumonia   . Rheumatic fever     Past Surgical History:  Procedure Laterality Date  . ABDOMINAL HYSTERECTOMY    . COLPOSCOPY    . CYSTOSCOPY N/A 10/09/2016   Procedure: CYSTOSCOPY;  Surgeon: Sanjuana Kava, MD;  Location: [facility];  Service: Gynecology;  Laterality: N/A;  . DILATION AND CURETTAGE OF UTERUS    . MASS  EXCISION Left 03/26/2017   Procedure: EXCISION SOFT TISSUE MASS LEFT FOOT;  Surgeon: Caprice Beaver, DPM;  Location: [facility];  Service: Podiatry;  Laterality: Left;  Marland Kitchen MASS EXCISION Right 03/26/2017   Procedure: EXCISION SOFT TISSUE MASS 2ND TOE RIGHT FOOT;  Surgeon: Caprice Beaver, DPM;  Location: [facility];  Service: Podiatry;  Laterality: Right;  . NASAL SEPTUM SURGERY    . TOTAL LAPAROSCOPIC HYSTERECTOMY WITH SALPINGECTOMY Bilateral 10/09/2016   Procedure: HYSTERECTOMY TOTAL LAPAROSCOPIC WITH SALPINGECTOMY;  Surgeon: Sanjuana Kava, MD;  Location: [facility];  Service: Gynecology;  Laterality: Bilateral;  . TUBAL LIGATION      Family History  Problem Relation Age of Onset  . Hypertension Mother   . Thyroid disease Mother   . Heart disease Father   . Heart attack Maternal Grandmother   . Heart attack Maternal Grandfather   . Heart attack Paternal Grandmother   . Heart attack Paternal Grandfather   . Alcohol abuse Brother   . ADD / ADHD Daughter   . Strabismus Daughter   . Migraines Daughter   . Alcohol abuse Brother      Social History   Social History  . Marital status: Single    Spouse name: N/A  . Number of children: 3  . Years of education: Associates   Occupational History  . Property Manager    Social History Main Topics  . Smoking status: Heavy Tobacco Smoker    Packs/day: 0.50    Years: 20.00    Types: Cigarettes  . Smokeless tobacco: Never Used  . Alcohol use Yes     Comment: social  . Drug use: No  . Sexual activity: Yes    Birth control/ protection: None   Other Topics Concern  . None   Social History Narrative   Lives at home with daughter.   Right-handed.   2-3 cups caffeine per day.   Engaged.    Currently not able to work b/c of leg pain.     Review of Systems  Constitutional: Negative for activity change, appetite change, fever and unexpected weight change.  HENT: Positive for congestion, postnasal drip, rhinorrhea and sinus pain. Negative for  dental problem, ear discharge, ear pain, nosebleeds, sore throat, trouble swallowing and voice change.   Eyes: Negative for visual disturbance.  Respiratory: Positive for cough. Negative for chest tightness and shortness of breath.   Cardiovascular: Negative for chest pain, palpitations and leg swelling.  Gastrointestinal: Negative for abdominal pain, diarrhea, nausea and vomiting.  Musculoskeletal: Negative for neck pain.  Skin: Negative for pallor and rash.  Neurological: Negative for dizziness, tremors, syncope, weakness, light-headedness and numbness.  Hematological: Negative for adenopathy. Does not bruise/bleed easily.  Psychiatric/Behavioral: Positive for  agitation, depression and dysphoric mood. Negative for behavioral problems, hallucinations and sleep disturbance. The patient is not nervous/anxious.        Believes that her mood is improved on the medication.     Objective:   BP 120/60 (BP Location: Left Arm, Patient Position: Sitting, Cuff Size: Normal)   Pulse 92   Temp 97.9 F (36.6 C) (Other (Comment))   Resp 16   Ht 5\' 2"  (1.575 m)   Wt 207 lb 8 oz (94.1 kg)   LMP 10/04/2016 (Exact Date)   SpO2 96%   BMI 37.95 kg/m   Physical Exam  Constitutional: She is oriented to person, place, and time. She appears well-developed and well-nourished. No distress.  HENT:  Head: Normocephalic and atraumatic.  Mouth/Throat: Uvula is midline, oropharynx is clear and moist and mucous membranes are normal. No oral lesions. No oropharyngeal exudate, posterior oropharyngeal edema, posterior oropharyngeal erythema or tonsillar abscesses. No tonsillar exudate.  Cobblestoning of pharynx with mucus drainage on exam.  Eyes: Pupils are equal, round, and reactive to light.  Neck: Normal range of motion. Neck supple. No thyromegaly present.  Cardiovascular: Normal rate, regular rhythm and normal heart sounds.   Pulmonary/Chest: Effort normal and breath sounds normal. No respiratory distress.    Neurological: She is alert and oriented to person, place, and time. No cranial nerve deficit.  Skin: Skin is warm and dry.  Psychiatric: Her speech is normal and behavior is normal. Judgment normal. Her affect is blunt. Cognition and memory are normal. She expresses no homicidal and no suicidal ideation. She expresses no suicidal plans and no homicidal plans.  Mood improved, still somewhat blunted. Affect consistent with mood.  Nursing note and vitals reviewed.    Assessment and Plan   1. Cough in adult Discussed possible etiologies of cough with patient. Suspect that at this time she has bronchitis, however she does have risk factors due to her history of tobacco use. We also discussed that cough could be due to postnasal drip and allergies, however this cough has been persistent for over 2 months. Patient will take medication as directed and if cough is persistent in the next 2 weeks she will obtain chest x-ray. Order was given. - DG Chest 2 View; Future  2. Tobacco use The 5 A's Model for treating Tobacco Use and Dependence was used today. I have identified and documented tobacco use status for this patient. I have urged the patient to quit tobacco use. At this time, the patient is unwilling and not ready to attempt to quit. I have provided patient with information regarding risks, cessation techniques, and interventions that might increase future attempts to quit smoking. I will plan on again addressing tobacco dependence at the next visit.   3. Hormone replacement therapy (HRT) Patient understands risk of hormone replacement therapy. She voiced understanding. These were again discussed and reiterated at this visit. She will continue the hormone replacement therapy at the lowest possible dose. However, she does feel that due to her symptoms and quality of life this medication is necessary at this time.  4. Acute bronchitis, unspecified organism Treat with antibiotic. Patient counseled  concerning worrisome signs and symptoms and if they develop she will return to clinic or call the office. - amoxicillin-clavulanate (AUGMENTIN) 875-125 MG tablet; Take 1 tablet by mouth 2 (two) times daily.  Dispense: 20 tablet; Refill: 0  5. Environmental and seasonal allergies Trial of medication discussed. Allergy avoidance discussed. - cetirizine (ZYRTEC) 10 MG tablet; Take 1 tablet (  10 mg total) by mouth daily.  Dispense: 30 tablet; Refill: 11  Suicide risks evaluated and documented in note if present or in the area below.  Patient does not have/denies the following risks: previous suicide attempts, family history of suicide, access to lethal means, prior history of psychiatric disorder, history of alcohol or substance abuse disorder, recent loss of a loved one, or severe hopelessness.Patient has protective factors of family and community support.  Patient reports that family believes is behaving rationally. Patient displays problem solving skills.   Patient specifically denies suicide ideation. Patient has access/information to healthcare contacts if situation or mood changes where patient is a risk to self or others or mood becomes unstable.   During the encounter, the patient had good eye contact and firm handshake regarding safety contract and agreement to seek help if mood worsens and not to harm self.   Patient understands the treatment plan and is in agreement. Agrees to keep follow up and call prior or return to clinic if needed.   Patient agrees that if her cough does not resolve she will come in prior to her next scheduled visit for her mood. Return in about 3 months (around 08/14/2017) for follow up/or sooner if needed. Caren Macadam, MD 05/14/2017

## 2017-05-23 ENCOUNTER — Other Ambulatory Visit: Payer: Self-pay | Admitting: Family Medicine

## 2017-06-03 ENCOUNTER — Encounter: Payer: Self-pay | Admitting: Family Medicine

## 2017-06-03 ENCOUNTER — Ambulatory Visit (HOSPITAL_COMMUNITY)
Admission: RE | Admit: 2017-06-03 | Discharge: 2017-06-03 | Disposition: A | Discharge status: Home / Self Care | Payer: 59 | Source: Ambulatory Visit | Attending: Family Medicine | Admitting: Family Medicine

## 2017-06-03 DIAGNOSIS — R05 Cough: Secondary | ICD-10-CM | POA: Insufficient documentation

## 2017-06-03 DIAGNOSIS — R059 Cough, unspecified: Secondary | ICD-10-CM

## 2017-06-16 ENCOUNTER — Ambulatory Visit (INDEPENDENT_AMBULATORY_CARE_PROVIDER_SITE_OTHER): Payer: 59 | Admitting: Otolaryngology

## 2017-06-28 ENCOUNTER — Other Ambulatory Visit: Payer: Self-pay | Admitting: Family Medicine

## 2017-07-11 ENCOUNTER — Encounter: Payer: Self-pay | Admitting: Family Medicine

## 2017-07-21 ENCOUNTER — Ambulatory Visit (INDEPENDENT_AMBULATORY_CARE_PROVIDER_SITE_OTHER): Payer: 59 | Admitting: Otolaryngology

## 2017-08-14 ENCOUNTER — Ambulatory Visit: Payer: 59 | Admitting: Family Medicine

## 2017-09-05 ENCOUNTER — Ambulatory Visit: Payer: Self-pay | Admitting: Family Medicine

## 2017-09-05 ENCOUNTER — Telehealth: Payer: Self-pay

## 2017-09-05 NOTE — Telephone Encounter (Signed)
I called pt to let her know I have canceled her appt today due to letter sent out on 08/14/17 to dismiss from [facility].  She made this appt on line.  She has multi no-shows from our office and others.  We are not able to see her any longer as of 08/14/17 except for sick visits for 30 days as stated in letter.  I have re-mailed her term letter. She said she did not receive the letter.

## 2017-09-11 ENCOUNTER — Ambulatory Visit: Payer: Self-pay | Admitting: Family Medicine

## 2018-01-08 LAB — LIPID PANEL
Cholesterol: 143 (ref 0–200)
LDL Cholesterol: 70
Triglycerides: 97 (ref 40–160)

## 2018-01-08 LAB — TSH: TSH: 6.9 — AB (ref <=5.90)

## 2018-10-02 ENCOUNTER — Emergency Department (HOSPITAL_COMMUNITY): Payer: Self-pay

## 2018-10-02 ENCOUNTER — Emergency Department (HOSPITAL_COMMUNITY)
Admission: EM | Admit: 2018-10-02 | Discharge: 2018-10-02 | Disposition: A | Discharge status: Home / Self Care | Payer: Self-pay | Attending: Emergency Medicine | Admitting: Emergency Medicine

## 2018-10-02 ENCOUNTER — Encounter (HOSPITAL_COMMUNITY): Payer: Self-pay

## 2018-10-02 ENCOUNTER — Other Ambulatory Visit: Payer: Self-pay

## 2018-10-02 DIAGNOSIS — F17210 Nicotine dependence, cigarettes, uncomplicated: Secondary | ICD-10-CM | POA: Insufficient documentation

## 2018-10-02 DIAGNOSIS — F909 Attention-deficit hyperactivity disorder, unspecified type: Secondary | ICD-10-CM | POA: Insufficient documentation

## 2018-10-02 DIAGNOSIS — Z79899 Other long term (current) drug therapy: Secondary | ICD-10-CM | POA: Insufficient documentation

## 2018-10-02 DIAGNOSIS — B349 Viral infection, unspecified: Secondary | ICD-10-CM | POA: Insufficient documentation

## 2018-10-02 LAB — INFLUENZA PANEL BY PCR (TYPE A & B)
Influenza A By PCR: NEGATIVE
Influenza B By PCR: NEGATIVE

## 2018-10-02 LAB — GROUP A STREP BY PCR: GROUP A STREP BY PCR: NOT DETECTED

## 2018-10-02 MED ORDER — PREDNISONE 20 MG PO TABS
40.0000 mg | ORAL_TABLET | Freq: Once | ORAL | Status: AC
  Administered 2018-10-02: 40 mg via ORAL
  Filled 2018-10-02: qty 2

## 2018-10-02 MED ORDER — HYDROCODONE-HOMATROPINE 5-1.5 MG/5ML PO SYRP: 5.0000 mL | ORAL_SOLUTION | Freq: Four times a day (QID) | ORAL | 0 refills | Status: DC | PRN

## 2018-10-02 MED ORDER — ACETAMINOPHEN 500 MG PO TABS
1000.0000 mg | ORAL_TABLET | Freq: Once | ORAL | Status: AC
  Administered 2018-10-02: 1000 mg via ORAL
  Filled 2018-10-02: qty 2

## 2018-10-02 MED ORDER — PSEUDOEPHEDRINE HCL 60 MG PO TABS
60.0000 mg | ORAL_TABLET | Freq: Once | ORAL | Status: AC
  Administered 2018-10-02: 60 mg via ORAL
  Filled 2018-10-02: qty 1

## 2018-10-02 MED ORDER — PREDNISONE 20 MG PO TABS: 40.0000 mg | ORAL_TABLET | Freq: Every day | ORAL | 0 refills | Status: DC

## 2018-10-02 NOTE — ED Provider Notes (Signed)
[facility] EMERGENCY DEPARTMENT Provider Note   CSN: [id number] Arrival date & time: 10/02/18  1542    History   Chief Complaint Chief Complaint  Patient presents with  . Fever    HPI Laurie Hutchinson is a 38 y.o. female.     Patient is a 38 year old female who presents to the emergency department with a complaint of fever, cough, congestion, and sleepiness.  The patient states this problem started on yesterday March 19.  The patient stated she started taking Tylenol to assist with her temperature elevation.  She says that her maximum temperature was 101.7.  The patient is concerned that she could have strep throat.  She says that she has had rheumatic fever in the past. The patient reports that she has not been on any commercial transportation recently.  She has not been out of the country recently.  And she has not been to any coronavirus hotspots here within the [place].  It is of note that her son who is in the [place] came from [place] a month ago, but she did not develop symptoms until yesterday.  It is also note that she has a daughter who has been ill recently.  The history is provided by the patient.    Past Medical History:  Diagnosis Date  . ADD (attention deficit disorder)   . ADHD   . Anemia   . Cancer (HCC)    cervical cancer  . Complication of anesthesia    unknown druf 2010 during surgery caused anaphylaxis. [facility] and were unable to obtain records form 2010 surgery. Did fine with 2018 TAH.  . Depression   . Deviated nasal septum   . GERD (gastroesophageal reflux disease)   . Heart murmur    had leaky mitral valve as child and also had rheumatic fever.  Marland Kitchen Heart palpitations   . Hypertension    with pregnancy  . Hypoglycemia   . Left leg pain   . Migraine headache   . 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  . Panic attack   . Pneumonia   . Rheumatic fever     Patient Active  Problem List   Diagnosis Date Noted  . Depression, major, single episode, in partial remission ([place]) 03/12/2017  . Hot flashes due to surgical menopause 03/12/2017  . Frequency of urination and polyuria 03/12/2017  . Fatigue 03/12/2017  . Left leg paresthesias 10/30/2016  . Status post laparoscopic hysterectomy 10/09/2016    Past Surgical History:  Procedure Laterality Date  . ABDOMINAL HYSTERECTOMY    . COLPOSCOPY    . CYSTOSCOPY N/A 10/09/2016   Procedure: CYSTOSCOPY;  Surgeon: Sanjuana Kava, MD;  Location: [facility];  Service: Gynecology;  Laterality: N/A;  . DILATION AND CURETTAGE OF UTERUS    . MASS EXCISION Left 03/26/2017   Procedure: EXCISION SOFT TISSUE MASS LEFT FOOT;  Surgeon: Caprice Beaver, DPM;  Location: [facility];  Service: Podiatry;  Laterality: Left;  Marland Kitchen MASS EXCISION Right 03/26/2017   Procedure: EXCISION SOFT TISSUE MASS 2ND TOE RIGHT FOOT;  Surgeon: Caprice Beaver, DPM;  Location: [facility];  Service: Podiatry;  Laterality: Right;  . NASAL SEPTUM SURGERY    . TOTAL LAPAROSCOPIC HYSTERECTOMY WITH SALPINGECTOMY Bilateral 10/09/2016   Procedure: HYSTERECTOMY TOTAL LAPAROSCOPIC WITH SALPINGECTOMY;  Surgeon: Sanjuana Kava, MD;  Location: [facility];  Service: Gynecology;  Laterality: Bilateral;  . TUBAL LIGATION       OB History  No obstetric history on file.      Home Medications    Prior to Admission medications   Medication Sig Start Date End Date Taking? Authorizing Provider  amoxicillin-clavulanate (AUGMENTIN) 875-125 MG tablet Take 1 tablet by mouth 2 (two) times daily. 05/14/17   Caren Macadam, MD  cetirizine (ZYRTEC) 10 MG tablet Take 1 tablet (10 mg total) by mouth daily. 05/14/17   Caren Macadam, MD  estradiol (ESTRACE) 1 MG tablet Take 1 tablet (1 mg total) by mouth 2 (two) times daily. 03/12/17   Caren Macadam, MD  estradiol (ESTRACE) 2 MG tablet TAKE ONE- HALF TABLET BY MOUTH TWICE DAILY. Patient not taking: Reported on 05/14/2017 05/07/17   Caren Macadam, MD   fluticasone [facility]) 50 MCG/ACT nasal spray Place 2 sprays into both nostrils daily. Patient taking differently: Place 2 sprays into both nostrils daily as needed for allergies or rhinitis.  03/12/17   Caren Macadam, MD  gabapentin (NEURONTIN) 100 MG capsule Take 100 mg by mouth 3 (three) times daily.     [provider]  gabapentin (NEURONTIN) 300 MG capsule Take 300 mg by mouth at bedtime.     [provider]  HYDROcodone-acetaminophen (NORCO) 7.5-325 MG tablet Take 1-2 tablets by mouth every 6 (six) hours as needed for moderate pain or severe pain.     [provider]  linaclotide (LINZESS) 145 MCG CAPS capsule Take 145 mcg by mouth daily.    [provider]  Multiple Vitamins-Minerals (ADULT GUMMY PO) Take 2 tablets by mouth daily. With iron    [provider]  oxyCODONE-acetaminophen (PERCOCET/ROXICET) 5-325 MG tablet  04/17/17   [provider]  venlafaxine XR (EFFEXOR-XR) 150 MG 24 hr capsule TAKE ONE CAPSULE BY MOUTH ONCE DAILY. 06/30/17   Caren Macadam, MD  vitamin B-12 (CYANOCOBALAMIN) 500 MCG tablet Take 500 mcg by mouth daily.    [provider]    Family History Family History  Problem Relation Age of Onset  . Hypertension Mother   . Thyroid disease Mother   . Heart disease Father   . Heart attack Maternal Grandmother   . Heart attack Maternal Grandfather   . Heart attack Paternal Grandmother   . Heart attack Paternal Grandfather   . Alcohol abuse Brother   . ADD / ADHD Daughter   . Strabismus Daughter   . Migraines Daughter   . Alcohol abuse Brother     Social History Social History   Tobacco Use  . Smoking status: Heavy Tobacco Smoker    Packs/day: 0.50    Years: 20.00    Pack years: 10.00    Types: Cigarettes  . Smokeless tobacco: Never Used  Substance Use Topics  . Alcohol use: Yes    Comment: social  . Drug use: No     Allergies   Other; Erythromycin; Neosporin [neomycin-bacitracin  zn-polymyx]; and Adhesive [tape]   Review of Systems Review of Systems  Constitutional: Positive for fatigue and fever. Negative for activity change.       All ROS Neg except as noted in HPI  HENT: Positive for congestion, postnasal drip and sore throat. Negative for nosebleeds.   Eyes: Negative for photophobia and discharge.  Respiratory: Positive for cough. Negative for shortness of breath and wheezing.   Cardiovascular: Negative for chest pain and palpitations.  Gastrointestinal: Negative for abdominal pain and blood in stool.  Genitourinary: Negative for dysuria, frequency and hematuria.  Musculoskeletal: Positive for myalgias. Negative for arthralgias, back pain and neck pain.  Skin:  Negative.   Neurological: Negative for dizziness, seizures and speech difficulty.  Psychiatric/Behavioral: Negative for confusion and hallucinations.     Physical Exam Updated Vital Signs BP (!) 157/102 (BP Location: Right Arm)   Pulse 92   Temp 98 F (36.7 C) (Oral)   Resp 18   Ht 5\' 3"  (1.6 m)   Wt 86.2 kg   LMP 10/04/2016 (Exact Date)   SpO2 97%   BMI 33.66 kg/m   Physical Exam Vitals signs and nursing note reviewed.  Constitutional:      Appearance: She is well-developed. She is not toxic-appearing.  HENT:     Head: Normocephalic.     Right Ear: Tympanic membrane and external ear normal.     Left Ear: Tympanic membrane and external ear normal.     Nose: Congestion present.     Mouth/Throat:     Pharynx: Posterior oropharyngeal erythema present.  Eyes:     General: Lids are normal.     Pupils: Pupils are equal, round, and reactive to light.  Neck:     Musculoskeletal: Normal range of motion and neck supple.     Vascular: No carotid bruit.  Cardiovascular:     Rate and Rhythm: Normal rate and regular rhythm.     Pulses: Normal pulses.     Heart sounds: Normal heart sounds.  Pulmonary:     Effort: No respiratory distress.     Breath sounds: Rhonchi present.  Abdominal:      General: Bowel sounds are normal.     Palpations: Abdomen is soft.     Tenderness: There is no abdominal tenderness. There is no guarding.  Musculoskeletal: Normal range of motion.  Lymphadenopathy:     Head:     Right side of head: No submandibular adenopathy.     Left side of head: No submandibular adenopathy.     Cervical: No cervical adenopathy.  Skin:    General: Skin is warm and dry.  Neurological:     Mental Status: She is alert and oriented to person, place, and time.     Cranial Nerves: No cranial nerve deficit.     Sensory: No sensory deficit.     Coordination: Coordination normal.     Gait: Gait normal.  Psychiatric:        Speech: Speech normal.      ED Treatments / Results  Labs (all labs ordered are listed, but only abnormal results are displayed) Labs Reviewed - No data to display  EKG None  Radiology No results found.  Procedures Procedures (including critical care time)  Medications Ordered in ED Medications - No data to display   Initial Impression / Assessment and Plan / ED Course  I have reviewed the triage vital signs and the nursing notes.  Pertinent labs & imaging results that were available during my care of the patient were reviewed by me and considered in my medical decision making (see chart for details).          Final Clinical Impressions(s) / ED Diagnoses MDM  Blood pressure is elevated at 157/102 on admission to the emergency department.  Vital signs otherwise within normal limits.  Pulse oximetry is 97% on room air.  Within normal limits by my interpretation.  Patient has complaint of sore throat, and history of rheumatic fever.  Will obtain strep screen.  Patient has been around family members and others who may have had the flu.  Will obtain a flu test.  Patient will also have  a chest x-ray as she has a few rhonchi on her examination, cough, and a history of being a smoker.  Strep test is negative.  Influenza panel is  negative.  Chest x-ray shows no acute airspace disease.  There is a convex fullness in the right hilar region.  The radiologist questions a lymphadenopathy, and suggest a contrast enhanced CT to be obtained when clinically appropriate.  I have discussed these findings with the patient in terms of which she understands.  Questions have been answered.  The patient will be treated With Decadron 40 mg daily for the next 5 days, Tylenol every 4 hours or ibuprofen every 6 hours for fever, and/or aching.  Salt water gargles, and Chloraseptic spray for sore throat.  Hycodan for cough.  A mask has been provided for the patient.  We discussed the importance of good handwashing and good hydration.  The patient is reminded again to see her primary physician to have the contrasted CT of her chest after she has resolution of this viral illness.  I have also asked the patient to quit smoking, and to speak with her doctor about smoking cessation techniques.  Patient is in agreement with this plan.   Final diagnoses:  Viral illness    ED Discharge Orders         Ordered    HYDROcodone-homatropine (HYCODAN) 5-1.5 MG/5ML syrup  Every 6 hours PRN     10/02/18 2003    predniSONE (DELTASONE) 20 MG tablet  Daily     10/02/18 2003           Lily Kocher, [place] 10/02/18 2032    Julianne Rice, MD 10/03/18 1539

## 2018-10-02 NOTE — ED Triage Notes (Signed)
Pt c/o sore throat, cough,  fever (100.6), that started yesterday. Pt reports taking 3 tylenol. Pt states "all I want to do is sleep".

## 2018-10-02 NOTE — Discharge Instructions (Addendum)
Your blood pressure is elevated at 157/102.  Please have this rechecked soon.  Your influenza test is negative.  Your strep test is negative.  Your chest x-ray is negative for pneumonia or other acute problems.  There is a prominent lymph node on the right lung that the radiologist is suggesting that you have followed up with a CT scanning of your chest.  Please make your primary physician aware of this finding, and after you have improved from your viral illness, consider having the CT study done.  Please use salt water gargles and Chloraseptic spray for assistance with your throat discomfort.  Please use Hycodan for cough.  Hycodan may cause drowsiness.  Please do not drive a vehicle, operate machinery, or participate in activities requiring concentration when taking this medication.  Use Tylenol extra strength every 4 hours, or 600 mg of ibuprofen every 6 hours for fever, pain, or aching.  Use prednisone 40 mg daily with meal over the next 5 days.  Please see your primary physician or return to the emergency department if any worsening of your symptoms, changes in your condition, problems, or concerns.  Please use your mask until symptoms have resolved.  Please wash your hands frequently, and have members of your family also wash hands frequently.

## 2018-10-13 ENCOUNTER — Emergency Department (HOSPITAL_COMMUNITY): Payer: Self-pay

## 2018-10-13 ENCOUNTER — Emergency Department (HOSPITAL_COMMUNITY)
Admission: EM | Admit: 2018-10-13 | Discharge: 2018-10-14 | Disposition: A | Discharge status: Home / Self Care | Payer: Self-pay | Attending: Emergency Medicine | Admitting: Emergency Medicine

## 2018-10-13 ENCOUNTER — Other Ambulatory Visit: Payer: Self-pay

## 2018-10-13 ENCOUNTER — Encounter (HOSPITAL_COMMUNITY): Payer: Self-pay | Admitting: *Deleted

## 2018-10-13 DIAGNOSIS — R059 Cough, unspecified: Secondary | ICD-10-CM

## 2018-10-13 DIAGNOSIS — I10 Essential (primary) hypertension: Secondary | ICD-10-CM | POA: Insufficient documentation

## 2018-10-13 DIAGNOSIS — J219 Acute bronchiolitis, unspecified: Secondary | ICD-10-CM | POA: Insufficient documentation

## 2018-10-13 DIAGNOSIS — J0100 Acute maxillary sinusitis, unspecified: Secondary | ICD-10-CM | POA: Insufficient documentation

## 2018-10-13 DIAGNOSIS — Z79899 Other long term (current) drug therapy: Secondary | ICD-10-CM | POA: Insufficient documentation

## 2018-10-13 DIAGNOSIS — Z91040 Latex allergy status: Secondary | ICD-10-CM | POA: Insufficient documentation

## 2018-10-13 DIAGNOSIS — R05 Cough: Secondary | ICD-10-CM

## 2018-10-13 DIAGNOSIS — J209 Acute bronchitis, unspecified: Secondary | ICD-10-CM

## 2018-10-13 DIAGNOSIS — Z85828 Personal history of other malignant neoplasm of skin: Secondary | ICD-10-CM | POA: Insufficient documentation

## 2018-10-13 DIAGNOSIS — F17210 Nicotine dependence, cigarettes, uncomplicated: Secondary | ICD-10-CM | POA: Insufficient documentation

## 2018-10-13 MED ORDER — ALBUTEROL SULFATE HFA 108 (90 BASE) MCG/ACT IN AERS
2.0000 | INHALATION_SPRAY | Freq: Once | RESPIRATORY_TRACT | Status: AC
  Administered 2018-10-13: 2 via RESPIRATORY_TRACT
  Filled 2018-10-13: qty 6.7

## 2018-10-13 NOTE — ED Provider Notes (Signed)
[facility] EMERGENCY DEPARTMENT Provider Note   CSN: [id number] Arrival date & time: 10/13/18  2100    History   Chief Complaint Chief Complaint  Patient presents with   Cough    HPI Laurie Hutchinson is a 37 y.o. female with a history of ADD, depression, GERD, and migraine headache and was seen here 2 weeks ago for a viral illness, at which time she had nasal discharge, sore throat, fever and myalgias with her strep and flu tests both negative at that time.  She was treated with Decadron, ibuprofen, use Chloraseptic spray and also Hycodan for symptom relief.  Her throat pain is improved and she is no longer having fevers, however she continues to have a nonproductive cough and generalized body aches but also has continued nasal congestion along with sinus pain and pressure.  Her nasal discharge has been clear and nonbloody but she endorses significant pain particularly at her right cheek.  She has also been using Flonase nasal spray which does not improve the symptom.  She denies shortness of breath, her cough is been nonproductive.  She does report occasional wheezing which is not present currently.     The history is provided by the patient.    Past Medical History:  Diagnosis Date   ADD (attention deficit disorder)    ADHD    Anemia    Cancer ([place])    cervical cancer   Complication of anesthesia    unknown druf 2010 during surgery caused anaphylaxis. [facility] and were unable to obtain records form 2010 surgery. Did fine with 2018 TAH.   Depression    Deviated nasal septum    GERD (gastroesophageal reflux disease)    Heart murmur    had leaky mitral valve as child and also had rheumatic fever.   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   Panic attack    Pneumonia    Rheumatic fever       Patient Active Problem List   Diagnosis Date Noted   Depression, major, single episode, in partial remission ([place]) 03/12/2017   Hot flashes due to surgical menopause 03/12/2017   Frequency of urination and polyuria 03/12/2017   Fatigue 03/12/2017   Left leg paresthesias 10/30/2016   Status post laparoscopic hysterectomy 10/09/2016    Past Surgical History:  Procedure Laterality Date   ABDOMINAL HYSTERECTOMY     COLPOSCOPY     CYSTOSCOPY N/A 10/09/2016   Procedure: CYSTOSCOPY;  Surgeon: Sanjuana Kava, MD;  Location: [facility];  Service: Gynecology;  Laterality: N/A;   DILATION AND CURETTAGE OF UTERUS     MASS EXCISION Left 03/26/2017   Procedure: EXCISION SOFT TISSUE MASS LEFT FOOT;  Surgeon: Caprice Beaver, DPM;  Location: [facility];  Service: Podiatry;  Laterality: Left;   MASS EXCISION Right 03/26/2017   Procedure: EXCISION SOFT TISSUE MASS 2ND TOE RIGHT FOOT;  Surgeon: Caprice Beaver, DPM;  Location: [facility];  Service: Podiatry;  Laterality: Right;   NASAL SEPTUM SURGERY     TOTAL LAPAROSCOPIC HYSTERECTOMY WITH SALPINGECTOMY Bilateral 10/09/2016   Procedure: HYSTERECTOMY TOTAL LAPAROSCOPIC WITH SALPINGECTOMY;  Surgeon: Sanjuana Kava, MD;  Location: [facility];  Service: Gynecology;  Laterality: Bilateral;   TUBAL LIGATION       OB History   No obstetric history  on file.      Home Medications    Prior to Admission medications   Medication Sig Start Date End Date Taking? Authorizing Provider  acetaminophen (TYLENOL) 500 MG tablet Take 500 mg by mouth every 6 (six) hours as needed for mild pain.   Yes [provider]  estradiol (ESTRACE) 1 MG tablet Take 1 tablet (1 mg total) by mouth 2 (two) times daily. 03/12/17  Yes Hagler, Apolonio Schneiders, MD  fluticasone (FLONASE) 50 MCG/ACT nasal spray Place 2 sprays into both nostrils daily. 03/12/17  Yes Caren Macadam, MD  doxycycline (VIBRAMYCIN) 100 MG capsule Take 1 capsule (100 mg total) by mouth 2 (two) times daily. 10/14/18    Sabriya Yono, Almyra Free, PA-C  HYDROcodone-homatropine (HYCODAN) 5-1.5 MG/5ML syrup Take 5 mLs by mouth every 6 (six) hours as needed. Patient not taking: Reported on 10/13/2018 10/02/18   Lily Kocher, PA-C  predniSONE (DELTASONE) 20 MG tablet Take 2 tablets (40 mg total) by mouth daily. Patient not taking: Reported on 10/13/2018 10/02/18   Lily Kocher, PA-C    Family History Family History  Problem Relation Age of Onset   Hypertension Mother    Thyroid disease Mother    Heart disease Father    Heart attack Maternal Grandmother    Heart attack Maternal Grandfather    Heart attack Paternal Grandmother    Heart attack Paternal Grandfather    Alcohol abuse Brother    ADD / ADHD Daughter    Strabismus Daughter    Migraines Daughter    Alcohol abuse Brother     Social History Social History   Tobacco Use   Smoking status: Light Tobacco Smoker    Packs/day: 0.50    Years: 20.00    Pack years: 10.00    Types: Cigarettes   Smokeless tobacco: Never Used  Substance Use Topics   Alcohol use: Yes    Comment: social   Drug use: No     Allergies   Other; Erythromycin; Neosporin [neomycin-bacitracin zn-polymyx]; Adhesive [tape]; and Latex   Review of Systems Review of Systems  Constitutional: Negative for chills and fever.  HENT: Positive for congestion, postnasal drip, rhinorrhea and sinus pain. Negative for ear pain, sinus pressure, sore throat, trouble swallowing and voice change.   Eyes: Negative for discharge.  Respiratory: Positive for cough and wheezing. Negative for shortness of breath and stridor.   Cardiovascular: Negative for chest pain.  Gastrointestinal: Negative for abdominal pain, nausea and vomiting.  Genitourinary: Negative.      Physical Exam Updated Vital Signs BP (!) 166/93 (BP Location: Left Arm)    Pulse 94    Temp 97.9 F (36.6 C) (Oral)    Resp 16    Ht 5\' 3"  (1.6 m)    Wt 86.2 kg    LMP 10/04/2016 (Exact Date)    SpO2 96%    BMI 33.66 kg/m    Physical Exam Vitals signs and nursing note reviewed.  Constitutional:      Appearance: She is well-developed.  HENT:     Head: Normocephalic and atraumatic.     Right Ear: Hearing and tympanic membrane normal.     Left Ear: Hearing and tympanic membrane normal.     Nose: Congestion and rhinorrhea present.     Left Sinus: Maxillary sinus tenderness present.  Eyes:     Conjunctiva/sclera: Conjunctivae normal.  Neck:     Musculoskeletal: Normal range of motion.  Cardiovascular:     Rate and Rhythm: Normal rate and regular rhythm.  Pulses: Normal pulses.     Heart sounds: Normal heart sounds.  Pulmonary:     Effort: Pulmonary effort is normal. No respiratory distress.     Breath sounds: Normal breath sounds. No wheezing or rhonchi.  Abdominal:     General: Bowel sounds are normal.     Palpations: Abdomen is soft.     Tenderness: There is no abdominal tenderness.  Musculoskeletal: Normal range of motion.  Skin:    General: Skin is warm and dry.  Neurological:     Mental Status: She is alert.      ED Treatments / Results  Labs (all labs ordered are listed, but only abnormal results are displayed) Labs Reviewed - No data to display  EKG None  Radiology Dg Chest [facility] 1 View  Result Date: 10/13/2018 CLINICAL DATA:  Cough and body aches. Recently diagnosed with viral illness. History of cervical cancer. EXAM: PORTABLE CHEST 1 VIEW COMPARISON:  Chest radiograph October 02, 2018 FINDINGS: Cardiomediastinal silhouette is normal. Prior mediastinal fullness no longer present, consistent with projectional artifact. No pleural effusions or focal consolidations. Mild bronchitic changes. Trachea projects midline and there is no pneumothorax. Soft tissue planes and included osseous structures are non-suspicious. IMPRESSION: Mild bronchitic changes without focal consolidation. Electronically Signed   By: Elon Alas M.D.   On: 10/13/2018 23:30    Procedures Procedures  (including critical care time)  Medications Ordered in ED Medications  albuterol (PROVENTIL HFA;VENTOLIN HFA) 108 (90 Base) MCG/ACT inhaler 2 puff (2 puffs Inhalation Given 10/13/18 2215)  doxycycline (VIBRA-TABS) tablet 100 mg (100 mg Oral Given 10/14/18 0024)     Initial Impression / Assessment and Plan / ED Course  I have reviewed the triage vital signs and the nursing notes.  Pertinent labs & imaging results that were available during my care of the patient were reviewed by me and considered in my medical decision making (see chart for details).        Patient with exam suggesting acute maxillary sinusitis, also with symptoms of cough and wheezing, although she has a normal respiratory exam at this time.  She was placed on doxycycline for treatment of her bronchitis which should cover her also for any bacterial component of her sinus symptoms.  She was given albuterol MDI here, which did improve her cough, no change in her respiratory exam, she had no wheezing during this ED visit, but reports has had this symptom.  Discussed home care for symptom relief.  Plan follow-up for any persistent or worsening symptoms.  X-ray was reviewed and discussed with patient.  She has no symptoms suggesting COVID-19 infection.   Final Clinical Impressions(s) / ED Diagnoses   Final diagnoses:  Acute bronchitis, unspecified organism  Acute non-recurrent maxillary sinusitis    ED Discharge Orders         Ordered    doxycycline (VIBRAMYCIN) 100 MG capsule  2 times daily     10/14/18 0003           Evalee Jefferson, PA-C 10/15/18 [place], [place], DO 10/17/18 1540

## 2018-10-13 NOTE — ED Triage Notes (Signed)
Pt seen here for same 2 weeks ago, pt here for cough and body aches.

## 2018-10-14 MED ORDER — DOXYCYCLINE HYCLATE 100 MG PO CAPS: 100.0000 mg | ORAL_CAPSULE | Freq: Two times a day (BID) | ORAL | 0 refills | Status: DC

## 2018-10-14 MED ORDER — DOXYCYCLINE HYCLATE 100 MG PO TABS
100.0000 mg | ORAL_TABLET | Freq: Once | ORAL | Status: AC
Start: 2018-10-14 — End: 2018-10-14
  Administered 2018-10-14: 100 mg via ORAL
  Filled 2018-10-14: qty 1

## 2018-10-14 NOTE — Discharge Instructions (Addendum)
Your xray suggests acute bronchitis so you are being covered with antibiotics and may help your sinus symptoms as well.  Continue using your flonase nasal spray.  Use the albuterol inhaler given - 2 puffs every 4 hours if you are coughing or wheezing.  I also recommend an allergy medicine such as claritin or zyrtec to help with your sinus symptoms (no prescription needed).

## 2019-03-10 ENCOUNTER — Ambulatory Visit
Admission: EM | Admit: 2019-03-10 | Discharge: 2019-03-10 | Disposition: A | Discharge status: Home / Self Care | Payer: 59 | Attending: Emergency Medicine | Admitting: Emergency Medicine

## 2019-03-10 ENCOUNTER — Other Ambulatory Visit: Payer: Self-pay

## 2019-03-10 DIAGNOSIS — Z711 Person with feared health complaint in whom no diagnosis is made: Secondary | ICD-10-CM | POA: Diagnosis present

## 2019-03-10 DIAGNOSIS — N3001 Acute cystitis with hematuria: Secondary | ICD-10-CM | POA: Insufficient documentation

## 2019-03-10 LAB — POCT URINALYSIS DIP (MANUAL ENTRY)
Bilirubin, UA: NEGATIVE
Glucose, UA: NEGATIVE mg/dL
Ketones, POC UA: NEGATIVE mg/dL
Nitrite, UA: NEGATIVE
Protein Ur, POC: NEGATIVE mg/dL
Spec Grav, UA: 1.025 (ref 1.010–1.025)
Urobilinogen, UA: 1 E.U./dL
pH, UA: 6.5 (ref 5.0–8.0)

## 2019-03-10 MED ORDER — NITROFURANTOIN MONOHYD MACRO 100 MG PO CAPS: 100.0000 mg | ORAL_CAPSULE | Freq: Two times a day (BID) | ORAL | 0 refills | Status: DC

## 2019-03-10 MED ORDER — PHENAZOPYRIDINE HCL 200 MG PO TABS: 200.0000 mg | ORAL_TABLET | Freq: Three times a day (TID) | ORAL | 0 refills | Status: DC

## 2019-03-10 NOTE — ED Provider Notes (Signed)
[facility]   CC: UTI; covid testing  SUBJECTIVE:  Laurie Hutchinson is a 38 y.o. female who complains of urinary frequency, urgency and dysuria for the past 3 days.  Admits to delayed bathroom breaks.  Also mentions lower abdominal pressure/ discomfort. Has NOT tried OTC medications.  Symptoms are made worse with urination.  Admits to similar symptoms in the past.  Denies fever, chills, nausea, vomiting, abdominal pain, flank pain, abnormal vaginal discharge, itchy, odor or bleeding, hematuria.    LMP: Patient's last menstrual period was 10/04/2016 (exact date). Had complete hysterectomy   Also requesting COVID test.  Asymptomatic.  Possible exposure 3 weeks ago.  Denies congestion, rhinorrhea, congestion, sore throat, cough, chest pain, chest pressure, SOB.    ROS: As in HPI.  All other pertinent ROS negative.     Past Medical History:  Diagnosis Date  . ADD (attention deficit disorder)   . ADHD   . Anemia   . Cancer ([facility])    cervical cancer  . Complication of anesthesia    unknown druf 2010 during surgery caused anaphylaxis. [facility] and were unable to obtain records form 2010 surgery. Did fine with 2018 TAH.  . Depression   . Deviated nasal septum   . GERD (gastroesophageal reflux disease)   . Heart murmur    had leaky mitral valve as child and also had rheumatic fever.  Marland Kitchen Heart palpitations   . Hypertension    with pregnancy  . Hypoglycemia   . Left leg pain   . Migraine headache   . 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  . Panic attack   . Pneumonia   . Rheumatic fever    Past Surgical History:  Procedure Laterality Date  . ABDOMINAL HYSTERECTOMY    . COLPOSCOPY    . CYSTOSCOPY N/A 10/09/2016   Procedure: CYSTOSCOPY;  Surgeon: Sanjuana Kava, MD;  Location: [facility];  Service: Gynecology;  Laterality: N/A;  . DILATION AND CURETTAGE OF UTERUS    . MASS EXCISION Left 03/26/2017   Procedure: EXCISION  SOFT TISSUE MASS LEFT FOOT;  Surgeon: Caprice Beaver, DPM;  Location: [facility];  Service: Podiatry;  Laterality: Left;  Marland Kitchen MASS EXCISION Right 03/26/2017   Procedure: EXCISION SOFT TISSUE MASS 2ND TOE RIGHT FOOT;  Surgeon: Caprice Beaver, DPM;  Location: [facility];  Service: Podiatry;  Laterality: Right;  . NASAL SEPTUM SURGERY    . TOTAL LAPAROSCOPIC HYSTERECTOMY WITH SALPINGECTOMY Bilateral 10/09/2016   Procedure: HYSTERECTOMY TOTAL LAPAROSCOPIC WITH SALPINGECTOMY;  Surgeon: Sanjuana Kava, MD;  Location: [facility];  Service: Gynecology;  Laterality: Bilateral;  . TUBAL LIGATION     Allergies  Allergen Reactions  . Other Anaphylaxis and Rash    Not sure of drug name but it was given to her during surgery in 2010  . Erythromycin     Abdominal cramping  . Neosporin [Neomycin-Bacitracin Zn-Polymyx] Itching  . Adhesive [Tape] Dermatitis and Rash    "Band-Aids" - Name Brand    . Latex Rash   No current facility-administered medications on file prior to encounter.    Current Outpatient Medications on File Prior to Encounter  Medication Sig Dispense Refill  . acetaminophen (TYLENOL) 500 MG tablet Take 500 mg by mouth every 6 (six) hours as needed for mild pain.    . fluticasone (FLONASE) 50 MCG/ACT nasal spray Place 2 sprays into both nostrils daily. 16 g 12  . [DISCONTINUED] estradiol (ESTRACE) 1 MG  tablet Take 1 tablet (1 mg total) by mouth 2 (two) times daily. 60 tablet 1   Social History   Socioeconomic History  . Marital status: Single    Spouse name: Not on file  . Number of children: 3  . Years of education: Associates  . Highest education level: Not on file  Occupational History  . Occupation: Secondary school teacher  Social Needs  . Financial resource strain: Not on file  . Food insecurity    Worry: Not on file    Inability: Not on file  . Transportation needs    Medical: Not on file    Non-medical: Not on file  Tobacco Use  . Smoking status: Light Tobacco Smoker    Packs/day:  0.50    Years: 20.00    Pack years: 10.00    Types: Cigarettes  . Smokeless tobacco: Never Used  Substance and Sexual Activity  . Alcohol use: Yes    Comment: social  . Drug use: No  . Sexual activity: Yes    Birth control/protection: None  Lifestyle  . Physical activity    Days per week: Not on file    Minutes per session: Not on file  . Stress: Not on file  Relationships  . Social Herbalist on phone: Not on file    Gets together: Not on file    Attends religious service: Not on file    Active member of club or organization: Not on file    Attends meetings of clubs or organizations: Not on file    Relationship status: Not on file  . Intimate partner violence    Fear of current or ex partner: Not on file    Emotionally abused: Not on file    Physically abused: Not on file    Forced sexual activity: Not on file  Other Topics Concern  . Not on file  Social History Narrative   Lives at home with daughter.   Right-handed.   2-3 cups caffeine per day.   Engaged.    Currently not able to work b/c of leg pain.    Family History  Problem Relation Age of Onset  . Hypertension Mother   . Thyroid disease Mother   . Heart disease Father   . Heart attack Maternal Grandmother   . Heart attack Maternal Grandfather   . Heart attack Paternal Grandmother   . Heart attack Paternal Grandfather   . Alcohol abuse Brother   . ADD / ADHD Daughter   . Strabismus Daughter   . Migraines Daughter   . Alcohol abuse Brother     OBJECTIVE:  Vitals:   03/10/19 1613  BP: 127/85  Pulse: 83  Resp: 20  Temp: 98.1 F (36.7 C)  SpO2: 95%   General appearance: Alert in no acute distress HEENT: NCAT.  Oropharynx clear.  Lungs: clear to auscultation bilaterally without adventitious breath sounds Heart: regular rate and rhythm.   Abdomen: soft; non-distended; mild diffuse TTP over lower abdomen; bowel sounds present; no guarding Back: no CVA tenderness Extremities: no edema;  symmetrical with no gross deformities Skin: warm and dry Neurologic: Ambulates from chair to exam table without difficulty Psychological: alert and cooperative; normal mood and affect  Labs Reviewed  POCT URINALYSIS DIP (MANUAL ENTRY) - Abnormal; Notable for the following components:      Result Value   Blood, UA trace-intact (*)    Leukocytes, UA Small (1+) (*)    All other components within normal limits  NOVEL CORONAVIRUS, NAA    ASSESSMENT & PLAN:  1. Acute cystitis with hematuria   2. Physically well but worried     Meds ordered this encounter  Medications  . nitrofurantoin, macrocrystal-monohydrate, (MACROBID) 100 MG capsule    Sig: Take 1 capsule (100 mg total) by mouth 2 (two) times daily.    Dispense:  10 capsule    Refill:  0    Order Specific Question:   Supervising Provider    Answer:   Raylene Everts [id number]  . phenazopyridine (PYRIDIUM) 200 MG tablet    Sig: Take 1 tablet (200 mg total) by mouth 3 (three) times daily.    Dispense:  6 tablet    Refill:  0    Order Specific Question:   Supervising Provider    Answer:   Raylene Everts [id number]   Urine showed signs of infection.   Urine culture sent.  We will call you with abnormal results.   Push fluids and get plenty of rest.   Take antibiotic as directed and to completion Take pyridium as prescribed and as needed for symptomatic relief Follow up with PCP if symptoms persists Return here or go to ER if you have any new or worsening symptoms such as fever, worsening abdominal pain, nausea/vomiting, flank pain, etc...  COVID testing ordered.  We will call you with abnormal results If you become symptomatic please remain isolated/ quarantined in your home for 10 days from symptoms onset or greater than 72 hours without fever, whichever is longer. Return or go to the ED if you develop shortness of breath, chest pain, chest pressure, confusion, lips/ fingers/ toes turning blue, etc...  Outlined signs  and symptoms indicating need for more acute intervention. Patient verbalized understanding. After Visit Summary given.     Lestine Box, PA-C 03/10/19 [phone]

## 2019-03-10 NOTE — ED Triage Notes (Signed)
Pt began having UTI symptoms on Sunday, also requesting covid testing, possible exposure 3 weeks ago , otherwise no symptoms

## 2019-03-10 NOTE — Discharge Instructions (Signed)
Urine showed signs of infection.   Urine culture sent.  We will call you with abnormal results.   Push fluids and get plenty of rest.   Take antibiotic as directed and to completion Take pyridium as prescribed and as needed for symptomatic relief Follow up with PCP if symptoms persists Return here or go to ER if you have any new or worsening symptoms such as fever, worsening abdominal pain, nausea/vomiting, flank pain, etc...  COVID testing ordered.  We will call you with abnormal results If you become symptomatic please remain isolated/ quarantined in your home for 10 days from symptoms onset or greater than 72 hours without fever, whichever is longer. Return or go to the ED if you develop shortness of breath, chest pain, chest pressure, confusion, lips/ fingers/ toes turning blue, etc..Marland Kitchen

## 2019-03-11 LAB — NOVEL CORONAVIRUS, NAA: SARS-CoV-2, NAA: NOT DETECTED

## 2019-03-12 ENCOUNTER — Encounter (HOSPITAL_COMMUNITY): Payer: Self-pay

## 2019-03-13 LAB — URINE CULTURE: Culture: 10000 — AB

## 2019-03-15 ENCOUNTER — Telehealth (HOSPITAL_COMMUNITY): Payer: Self-pay | Admitting: Emergency Medicine

## 2019-03-15 NOTE — Telephone Encounter (Signed)
Pt was treated as a UTI, culture only shows 10,000 colonies. Contacted patient to see how she was feeling, pt states she is taking antibiotics without any relief. Spoke with patient and informed her that due to low colony count and still symptomatic, pt needs to be reseen again. Pt states she will try and call her PCP to follow up and if not able to, will come here.

## 2019-03-18 ENCOUNTER — Other Ambulatory Visit: Payer: Self-pay

## 2019-03-18 ENCOUNTER — Encounter: Payer: Self-pay | Admitting: Family Medicine

## 2019-03-18 ENCOUNTER — Ambulatory Visit (INDEPENDENT_AMBULATORY_CARE_PROVIDER_SITE_OTHER): Payer: 59 | Admitting: Family Medicine

## 2019-03-18 VITALS — BP 138/86 | HR 91 | Temp 97.7°F | Ht 63.0 in | Wt 198.8 lb

## 2019-03-18 DIAGNOSIS — N959 Unspecified menopausal and perimenopausal disorder: Secondary | ICD-10-CM | POA: Diagnosis not present

## 2019-03-18 DIAGNOSIS — Z79890 Hormone replacement therapy (postmenopausal): Secondary | ICD-10-CM

## 2019-03-18 DIAGNOSIS — N390 Urinary tract infection, site not specified: Secondary | ICD-10-CM | POA: Diagnosis not present

## 2019-03-18 DIAGNOSIS — R5383 Other fatigue: Secondary | ICD-10-CM | POA: Diagnosis not present

## 2019-03-18 LAB — POCT URINALYSIS DIP (MANUAL ENTRY)
Bilirubin, UA: NEGATIVE
Blood, UA: NEGATIVE
Glucose, UA: NEGATIVE mg/dL
Ketones, POC UA: NEGATIVE mg/dL
Leukocytes, UA: NEGATIVE
Nitrite, UA: POSITIVE — AB
Spec Grav, UA: >=1.03 — AB (ref 1.010–1.025)
Urobilinogen, UA: 0.2 E.U./dL
pH, UA: 5.5 (ref 5.0–8.0)

## 2019-03-18 MED ORDER — AMOXICILLIN 250 MG PO CAPS: 250.0000 mg | ORAL_CAPSULE | Freq: Two times a day (BID) | ORAL | 0 refills | Status: DC

## 2019-03-18 MED ORDER — PREMARIN 0.625 MG/GM VA CREA: 1.0000 | TOPICAL_CREAM | Freq: Every day | VAGINAL | 12 refills | Status: DC

## 2019-03-18 NOTE — Patient Instructions (Signed)
Start estrogen vaginally nightly Thyroid labwork

## 2019-03-18 NOTE — Progress Notes (Signed)
New Patient Office Visit  Subjective:  Patient ID: Laurie Hutchinson, female    DOB: [birth year]  Age: 38 y.o. MRN: [id number]  CC:  Chief Complaint  Patient presents with  . New Patient (Initial Visit)  . Hypothyroidism  . Hot Flashes  GYN-Dr. Clarita Leber Hutchinson-estrogen-orally- pt has been out of medication for 6 months. Pt states she previously tried patches but it caused skin irritation.   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 ADD (attention deficit disorder)   . ADHD   . Anemia   . Cancer (HCC)    cervical cancer  . Complication of anesthesia    unknown druf 2010 during surgery caused anaphylaxis. [facility] and were unable to obtain records form 2010 surgery. Did fine with 2018 TAH.  . Depression   . Deviated nasal septum   . GERD (gastroesophageal reflux disease)   . Heart murmur    had leaky mitral valve as child and also had rheumatic fever.  Marland Kitchen Heart palpitations   . Hypertension    with pregnancy  . Hypoglycemia   . Left leg pain   . Migraine headache   . 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  . Panic attack   . Pneumonia   . Rheumatic fever     Past Surgical History:  Procedure Laterality Date  . ABDOMINAL HYSTERECTOMY    . COLPOSCOPY    . CYSTOSCOPY N/A 10/09/2016   Procedure: CYSTOSCOPY;  Surgeon: Sanjuana Kava, MD;  Location: [facility];  Service: Gynecology;  Laterality: N/A;  . DILATION AND CURETTAGE OF UTERUS    . MASS EXCISION Left 03/26/2017   Procedure: EXCISION SOFT TISSUE MASS LEFT FOOT;  Surgeon:  Caprice Beaver, DPM;  Location: [facility];  Service: Podiatry;  Laterality: Left;  Marland Kitchen MASS EXCISION Right 03/26/2017   Procedure: EXCISION SOFT TISSUE MASS 2ND TOE RIGHT FOOT;  Surgeon: Caprice Beaver, DPM;  Location: [facility];  Service: Podiatry;  Laterality: Right;  . NASAL SEPTUM SURGERY    . TOTAL LAPAROSCOPIC HYSTERECTOMY WITH SALPINGECTOMY Bilateral 10/09/2016   Procedure: HYSTERECTOMY TOTAL LAPAROSCOPIC WITH SALPINGECTOMY;  Surgeon: Sanjuana Kava, MD;  Location: [facility];  Service: Gynecology;  Laterality: Bilateral;  . TUBAL LIGATION      Family History  Problem Relation Age of Onset  . Hypertension Mother   . Thyroid disease Mother   . Heart disease Father   . Heart attack Maternal Grandmother   . Heart attack Maternal Grandfather   . Heart attack Paternal Grandmother   . Heart attack Paternal Grandfather   . Alcohol abuse Brother   . ADD / ADHD Daughter   . Strabismus Daughter   . Migraines Daughter   . Alcohol abuse Brother     Social History   Socioeconomic History  . Marital status: Single    Spouse name: Not on file  . Number of children: 3  . Years of education: Associates  . Highest education level: Not on file  Occupational History  . Occupation: Secondary school teacher  Social Needs  .  Financial resource strain: Not on file  . Food insecurity    Worry: Not on file    Inability: Not on file  . Transportation needs    Medical: Not on file    Non-medical: Not on file  Tobacco Use  . Smoking status: Light Tobacco Smoker    Packs/day: 0.50    Years: 20.00    Pack years: 10.00    Types: Cigarettes  . Smokeless tobacco: Never Used  Substance and Sexual Activity  . Alcohol use: Yes    Comment: social  . Drug use: No  . Sexual activity: Yes    Birth control/protection: None  Lifestyle  . Physical activity    Days per week: Not on file    Minutes per session: Not on file  . Stress: Not on file  Relationships  . Social Herbalist on phone: Not on  file    Gets together: Not on file    Attends religious service: Not on file    Active member of club or organization: Not on file    Attends meetings of clubs or organizations: Not on file    Relationship status: Not on file  . Intimate partner violence    Fear of current or ex partner: Not on file    Emotionally abused: Not on file    Physically abused: Not on file    Forced sexual activity: Not on file  Other Topics Concern  . Not on file  Social History Narrative   Lives at home with daughter.   Right-handed.   2-3 cups caffeine per day.   Engaged.    Currently not able to work b/c of leg pain.     ROS Review of Systems  Constitutional: Positive for fatigue and unexpected weight change. Negative for fever.       Weight gain  Genitourinary: Positive for dysuria and pelvic pain. Negative for menstrual problem.    Objective:   Today's Vitals: BP 138/86 (BP Location: Left Arm, Patient Position: Sitting, Cuff Size: Normal)   Pulse 91   Temp 97.7 F (36.5 C) (Oral)   Ht 5\' 3"  (1.6 m)   Wt 198 lb 12.8 oz (90.2 kg)   LMP 10/04/2016 (Exact Date)   SpO2 95%   BMI 35.22 kg/m   Physical Exam Constitutional:      Appearance: Normal appearance.  HENT:     Head: Normocephalic and atraumatic.  Neck:     Musculoskeletal: Normal range of motion and neck supple.  Cardiovascular:     Rate and Rhythm: Normal rate and regular rhythm.     Pulses: Normal pulses.     Heart sounds: Normal heart sounds.  Neurological:     Mental Status: She is alert.     Assessment & Plan:  1. Urinary tract infection without hematuria, site unspecified Start Amoxil-rx - POCT urinalysis dipstick - Urine Culture  2. Fatigue, unspecified type - TSH-concern for hypothyroid  3. Hormone replacement therapy (HRT) Trial of vaginal estrogen instead of oral-vaginal irritation and hot flashes  4. Post menopausal problems Hot flashes. Weight gain  Outpatient Encounter Medications as of 03/18/2019   Medication Sig  . [DISCONTINUED] acetaminophen (TYLENOL) 500 MG tablet Take 500 mg by mouth every 6 (six) hours as needed for mild pain.  . [DISCONTINUED] estradiol (ESTRACE) 1 MG tablet Take 1 tablet (1 mg total) by mouth 2 (two) times daily.  . [DISCONTINUED] fluticasone (FLONASE) 50 MCG/ACT nasal spray Place 2 sprays into both  nostrils daily. (Patient not taking: Reported on 03/18/2019)  . [DISCONTINUED] nitrofurantoin, macrocrystal-monohydrate, (MACROBID) 100 MG capsule Take 1 capsule (100 mg total) by mouth 2 (two) times daily.  . [DISCONTINUED] phenazopyridine (PYRIDIUM) 200 MG tablet Take 1 tablet (200 mg total) by mouth 3 (three) times daily. (Patient not taking: Reported on 03/18/2019)   No facility-administered encounter medications on file as of 03/18/2019.     Follow-up: TSH tomorrow Obtain labwork at weight loss clinic +UTI start Amoxil-d/w pt by telephone   Hannah Beat, MD

## 2019-03-19 LAB — TSH: TSH: 11.22 mIU/L — ABNORMAL HIGH

## 2019-03-20 LAB — URINE CULTURE
MICRO NUMBER:: 845657
SPECIMEN QUALITY:: ADEQUATE

## 2019-03-20 LAB — EXTRA URINE SPECIMEN

## 2019-03-23 ENCOUNTER — Other Ambulatory Visit: Payer: Self-pay | Admitting: Family Medicine

## 2019-03-23 ENCOUNTER — Telehealth: Payer: Self-pay | Admitting: Family Medicine

## 2019-03-23 DIAGNOSIS — E039 Hypothyroidism, unspecified: Secondary | ICD-10-CM

## 2019-03-23 NOTE — Telephone Encounter (Signed)
Patient is requesting lab results.

## 2019-03-24 NOTE — Telephone Encounter (Signed)
You have hypothyroid Please start taking synthroid 26mcg daily You will need to repeat blood level in 6 weeks Take medication first thing in the morning. DO NOT take other medications or vitamins at the same time.  Drink water but avoid food at the same time as you are taking synthroid  Your urine culture was positive for ecoli The medication should resolve the infection. If you continue to have symptoms, make an appointment to repeat your urinalysis

## 2019-03-24 NOTE — Telephone Encounter (Signed)
Routing to Dr. Corum 

## 2019-03-25 ENCOUNTER — Telehealth: Payer: Self-pay

## 2019-03-25 DIAGNOSIS — E039 Hypothyroidism, unspecified: Secondary | ICD-10-CM

## 2019-03-25 MED ORDER — LEVOTHYROXINE SODIUM 50 MCG PO TABS: ORAL_TABLET | ORAL | 0 refills | Status: DC

## 2019-03-25 NOTE — Telephone Encounter (Signed)
Patient is aware of results and recommendations.

## 2019-03-25 NOTE — Telephone Encounter (Signed)
LeighAnn Philopater Mucha, CMA  

## 2019-04-07 NOTE — Telephone Encounter (Signed)
[place] Dr Holly Bodily is out of the office until Monday 04/12/19 and if you think that you need to be seen before then please go to Urgent Care on [address] Deborha Payment do take Walk-ins Phone # is [phone].  ===View-only below this line===   ----- Message -----      From:Kaneesha N Ow      Sent:04/07/2019 10:04 AM EDT        [id number] Hannah Beat, MD   Subject:Appointment Request  Appointment Request From: [name]  With Provider: Mertha Baars, MD [facility]]  Preferred Date Range: 04/08/2019 - 04/09/2019  Preferred Times: Any Time  Reason for visit: Office Visit  Comments: My right ear is stopped up and will not pop and it is hurting really bad I have tried multiple times it feels like I'm talking out of my ear   It feels really cloudy didn't know if I had to come in or do a virtual visit please let me know

## 2019-05-06 ENCOUNTER — Other Ambulatory Visit: Payer: Self-pay

## 2019-05-06 ENCOUNTER — Ambulatory Visit (INDEPENDENT_AMBULATORY_CARE_PROVIDER_SITE_OTHER): Payer: 59 | Admitting: Family Medicine

## 2019-05-06 ENCOUNTER — Encounter: Payer: Self-pay | Admitting: Family Medicine

## 2019-05-06 VITALS — BP 131/86 | HR 83 | Temp 98.0°F | Ht 63.0 in | Wt 198.6 lb

## 2019-05-06 DIAGNOSIS — N390 Urinary tract infection, site not specified: Secondary | ICD-10-CM | POA: Insufficient documentation

## 2019-05-06 DIAGNOSIS — E039 Hypothyroidism, unspecified: Secondary | ICD-10-CM

## 2019-05-06 DIAGNOSIS — N3001 Acute cystitis with hematuria: Secondary | ICD-10-CM

## 2019-05-06 LAB — POCT URINALYSIS DIP (CLINITEK)
Glucose, UA: NEGATIVE mg/dL
Ketones, POC UA: NEGATIVE mg/dL
Leukocytes, UA: NEGATIVE
Nitrite, UA: NEGATIVE
POC PROTEIN,UA: 100 — AB
Spec Grav, UA: >=1.03 — AB (ref 1.010–1.025)
Urobilinogen, UA: 0.2 E.U./dL
pH, UA: 6 (ref 5.0–8.0)

## 2019-05-06 NOTE — Progress Notes (Signed)
Established Patient Office Visit  Subjective:  Patient ID: Laurie Hutchinson, female    DOB: [birth year]  Age: 38 y.o. MRN: [id number]  CC:  Chief Complaint  Patient presents with  . Follow-up  . Hypothyroidism    HPI Laurie Hutchinson presents for hypothyroid-pt unable to have labwork completed-taking 70mcg daily. Pt states she continues to be tired. Pt would like a recheck on urinalysis-took Amoxil for UTI-feeling better.  Pt states right ear pain 2 weeks ago. Pt states she tried to pop but was not able to open the ear-hearing was muffled-pt was in the mts in [place] at onset.  Gradual improvement. Pt states she was congested at the time of the onset of ear pain.   Past Medical History:  Diagnosis Date  . ADD (attention deficit disorder)   . ADHD   . Anemia   . Cancer (HCC)    cervical cancer  . Complication of anesthesia    unknown druf 2010 during surgery caused anaphylaxis. [facility] and were unable to obtain records form 2010 surgery. Did fine with 2018 TAH.  . Depression   . Deviated nasal septum   . GERD (gastroesophageal reflux disease)   . Heart murmur    had leaky mitral valve as child and also had rheumatic fever.  Marland Kitchen Heart palpitations   . Hypertension    with pregnancy  . Hypoglycemia   . Left leg pain   . Migraine headache   . 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  . Panic attack   . Pneumonia   . Rheumatic fever     Past Surgical History:  Procedure Laterality Date  . ABDOMINAL HYSTERECTOMY    . COLPOSCOPY    . CYSTOSCOPY N/A 10/09/2016   Procedure: CYSTOSCOPY;  Surgeon: Sanjuana Kava, MD;  Location: [facility];  Service: Gynecology;  Laterality: N/A;  . DILATION AND CURETTAGE OF UTERUS    . MASS EXCISION Left 03/26/2017   Procedure: EXCISION SOFT TISSUE MASS LEFT FOOT;  Surgeon: Caprice Beaver, DPM;  Location: [facility];  Service: Podiatry;  Laterality: Left;  Marland Kitchen MASS EXCISION Right 03/26/2017   Procedure: EXCISION SOFT TISSUE MASS 2ND TOE RIGHT FOOT;  Surgeon: Caprice Beaver, DPM;  Location: [facility];  Service: Podiatry;  Laterality: Right;  . NASAL SEPTUM SURGERY    . TOTAL LAPAROSCOPIC HYSTERECTOMY WITH SALPINGECTOMY Bilateral 10/09/2016   Procedure: HYSTERECTOMY TOTAL LAPAROSCOPIC WITH SALPINGECTOMY;  Surgeon: Sanjuana Kava, MD;  Location: [facility];  Service: Gynecology;  Laterality: Bilateral;  . TUBAL LIGATION      Family History  Problem Relation Age of Onset  . Hypertension Mother   . Thyroid disease Mother   . Heart disease Father   . Heart attack Maternal Grandmother   . Heart attack Maternal Grandfather   . Heart attack Paternal Grandmother   . Heart attack Paternal Grandfather   . Alcohol abuse Brother   . ADD / ADHD Daughter   . Strabismus Daughter   . Migraines Daughter   . Alcohol abuse Brother     Social History   Socioeconomic History  . Marital status: Single    Spouse name: Not on file  . Number of children: 3  . Years of education: Associates  . Highest education level: Not on file  Occupational History  . Occupation: Secondary school teacher  Social Needs  . Financial resource strain: Not on file  . Food insecurity    Worry: Not on  file    Inability: Not on file  . Transportation needs    Medical: Not on file    Non-medical: Not on file  Tobacco Use  . Smoking status: Light Tobacco Smoker    Packs/day: 0.50    Years: 20.00    Pack years: 10.00    Types: Cigarettes  . Smokeless tobacco: Never Used  Substance and Sexual Activity  . Alcohol use: Yes    Comment: social  . Drug use: No  . Sexual activity: Yes    Birth control/protection: None  Lifestyle  . Physical activity    Days per week: Not on file    Minutes per session: Not on file  . Stress: Not on file  Relationships  . Social Herbalist on phone: Not on file    Gets together: Not on file    Attends religious service: Not on file    Active member of club or organization:  Not on file    Attends meetings of clubs or organizations: Not on file    Relationship status: Not on file  . Intimate partner violence    Fear of current or ex partner: Not on file    Emotionally abused: Not on file    Physically abused: Not on file    Forced sexual activity: Not on file  Other Topics Concern  . Not on file  Social History Narrative   Lives at home with daughter.   Right-handed.   2-3 cups caffeine per day.   Engaged.    Currently not able to work b/c of leg pain.     Outpatient Medications Prior to Visit  Medication Sig Dispense Refill  . amoxicillin (AMOXIL) 250 MG capsule Take 1 capsule (250 mg total) by mouth 2 (two) times daily. 10 capsule 0  . conjugated estrogens (PREMARIN) vaginal cream Place 1 Applicatorful vaginally daily. 42.5 g 12  . levothyroxine (SYNTHROID) 50 MCG tablet Take Medication first thing in the morning with water on a empty stomach 30 tablet 0   No facility-administered medications prior to visit.     Allergies  Allergen Reactions  . Other Anaphylaxis and Rash    Not sure of drug name but it was given to her during surgery in 2010  . Erythromycin     Abdominal cramping  . Neosporin [Neomycin-Bacitracin Zn-Polymyx] Itching  . Adhesive [Tape] Dermatitis and Rash    "Band-Aids" - Name Brand    . Latex Rash    ROS Review of Systems  Constitutional: Positive for fatigue. Negative for fever.  HENT: Positive for congestion and ear pain.        Right ear pain-resolved after 2 weeks  Respiratory: Negative.   Cardiovascular: Negative.   Genitourinary:       Pain with urination has resolved with antibiotics, no blood,no abdominal pain      Objective:    Physical Exam  Constitutional: She is oriented to person, place, and time. She appears well-developed and well-nourished.  HENT:  Head: Normocephalic and atraumatic.  Right Ear: External ear normal.  Left Ear: External ear normal.  Nose: Nose normal.  Mouth/Throat: Oropharynx  is clear and moist.  Eyes: Conjunctivae are normal.  Cardiovascular: Normal rate and regular rhythm.  Pulmonary/Chest: Effort normal and breath sounds normal.  Neurological: She is oriented to person, place, and time.  Psychiatric: She has a normal mood and affect. Her behavior is normal.    BP 131/86 (BP Location: Right Arm, Patient Position: Sitting,  Cuff Size: Normal)   Pulse 83   Temp 98 F (36.7 C) (Oral)   Ht 5\' 3"  (1.6 m)   Wt 198 lb 9.6 oz (90.1 kg)   LMP 10/04/2016 (Exact Date)   SpO2 96%   BMI 35.18 kg/m  Wt Readings from Last 3 Encounters:  05/06/19 198 lb 9.6 oz (90.1 kg)  03/18/19 198 lb 12.8 oz (90.2 kg)  10/13/18 190 lb (86.2 kg)     Health Maintenance Due  Topic Date Due  . HIV Screening  01/19/1995  . TETANUS/TDAP  01/19/1999  . PAP SMEAR-Modifier  01/18/2001  . INFLUENZA VACCINE  02/13/2019     Lab Results  Component Value Date   TSH 11.22 (H) 03/19/2019   Lab Results  Component Value Date   WBC 9.2 03/18/2017   HGB 13.6 03/18/2017   HCT 40.9 03/18/2017   MCV 93.2 03/18/2017   PLT 251 03/18/2017   Lab Results  Component Value Date   NA 136 03/18/2017   K 3.5 03/18/2017   CO2 26 03/18/2017   GLUCOSE 90 03/18/2017   BUN 11 03/18/2017   CREATININE 0.60 03/18/2017   CALCIUM 9.1 03/18/2017   ANIONGAP 6 03/18/2017   Lab Results  Component Value Date   CHOL 143 01/08/2018   No results found for: HDL Lab Results  Component Value Date   LDLCALC 70 01/08/2018   Lab Results  Component Value Date   TRIG 97 01/08/2018     Assessment & Plan:  1. Acute cystitis with hematuria Trace blood noted-drink plenty of water-bring a urine sample for recheck in 2 weeks-pt completed Amoxil. No further pain with urination - POCT URINALYSIS DIP (CLINITEK)  2. Hypothyroidism, unspecified type D/w pt need to recheck TSH to make sure dosage correct Follow-up: labwork TSH  Marshella Tello Hannah Beat, MD

## 2019-05-06 NOTE — Patient Instructions (Addendum)
Drink plenty of water, avoid caffeine-recheck urine in 2 weeks-ok to drop off specimen at clinic  Thyroid level drawn at your convenience-none fasting

## 2019-05-18 ENCOUNTER — Ambulatory Visit: Payer: 59 | Admitting: Family Medicine

## 2019-07-02 ENCOUNTER — Other Ambulatory Visit: Payer: Self-pay | Admitting: Family Medicine

## 2019-07-02 ENCOUNTER — Encounter: Payer: Self-pay | Admitting: Family Medicine

## 2019-07-02 DIAGNOSIS — E039 Hypothyroidism, unspecified: Secondary | ICD-10-CM

## 2019-07-02 MED ORDER — LEVOTHYROXINE SODIUM 50 MCG PO TABS: ORAL_TABLET | ORAL | 1 refills | Status: DC

## 2019-09-15 ENCOUNTER — Telehealth (HOSPITAL_COMMUNITY): Payer: Self-pay | Admitting: Family Medicine

## 2019-09-15 ENCOUNTER — Telehealth: Payer: Self-pay | Admitting: Family Medicine

## 2019-09-15 ENCOUNTER — Other Ambulatory Visit: Payer: Self-pay | Admitting: Emergency Medicine

## 2019-09-15 DIAGNOSIS — F329 Major depressive disorder, single episode, unspecified: Secondary | ICD-10-CM

## 2019-09-15 DIAGNOSIS — F32A Depression, unspecified: Secondary | ICD-10-CM

## 2019-09-15 MED ORDER — PAROXETINE HCL 10 MG PO TABS: 10.0000 mg | ORAL_TABLET | Freq: Every day | ORAL | 1 refills | Status: DC

## 2019-09-15 MED ORDER — LORAZEPAM 0.5 MG PO TABS: 0.5000 mg | ORAL_TABLET | Freq: Four times a day (QID) | ORAL | 1 refills | Status: DC | PRN

## 2019-09-15 NOTE — BH Assessment (Signed)
Writer was able to speak to the patient briefly.  Writer introduced myself and explained the [facility] program.    Patient was in the process of getting ready to go to the hospital.  Patient reports that she is receptive to receiving services and Probation officer or Sherilyn Dacosta will follow up with the patient tomorrow.   Patient denies SI/HI/Psychosis/Substance Abuse. If your symptoms worsen or you have thoughts of suicide/homicide, PLEASE SEEK IMMEDIATE MEDICAL ATTENTION.  You may always call:  National Suicide Hotline: 301-467-2061;  [place] Crisis Line: (951)119-2558;  Crisis Recovery in [place]: (910)757-5528.  These are available 24 hours a day, 7 days a week.
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
## 2019-09-15 NOTE — Telephone Encounter (Signed)
Kela Millin discussed situation with this [facility], [name].  Ms. Canseco would prefer a call back tomorrow 09/15/19.  This [facility] will follow up with Ms. Aguila on 09/15/19.

## 2019-09-16 ENCOUNTER — Telehealth (INDEPENDENT_AMBULATORY_CARE_PROVIDER_SITE_OTHER): Payer: 59 | Admitting: Clinical

## 2019-09-16 DIAGNOSIS — F4323 Adjustment disorder with mixed anxiety and depressed mood: Secondary | ICD-10-CM
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
## 2019-09-22 ENCOUNTER — Telehealth (INDEPENDENT_AMBULATORY_CARE_PROVIDER_SITE_OTHER): Payer: 59 | Admitting: Clinical

## 2019-09-22 DIAGNOSIS — F4323 Adjustment disorder with mixed anxiety and depressed mood: Secondary | ICD-10-CM
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
## 2019-09-24 ENCOUNTER — Telehealth (INDEPENDENT_AMBULATORY_CARE_PROVIDER_SITE_OTHER): Payer: 59 | Admitting: Clinical

## 2019-09-24 DIAGNOSIS — F4323 Adjustment disorder with mixed anxiety and depressed mood: Secondary | ICD-10-CM

## 2019-09-24 NOTE — BH Specialist Note (Signed)
8am TC to [place], she requested a call back at 8:30am since she is getting ready for work.  8:35am TC to [place], no answer. This Behavioral Health Clinician left a message to call back with name & contact information.   [place] Telephone Follow-up  MRN: [id number] NAME: QUADIRAH HOLDEN Date: 09/29/19  Start time: Start Time: 0910 End time: Stop Time: 0928 Total time: Total Time in Minutes (Visit): 18 Call number: Visit Number: 2- Second Visit  Reason for call today: Reason for Contact: Other (Comment)(1 week follow up)  PHQ-9 Scores:  Depression screen [facility] 2/9 09/16/2019 03/18/2019 03/12/2017  Decreased Interest 3 0 3  Down, Depressed, Hopeless 3 0 3  PHQ - 2 Score 6 0 6  Altered sleeping 3 - 3  Tired, decreased energy 3 - 2  Change in appetite 3 - 2  Feeling bad or failure about yourself  3 - 0  Trouble concentrating 3 - 1  Moving slowly or fidgety/restless 1 - 0  Suicidal thoughts 1 - 0  PHQ-9 Score 23 - 14  Difficult doing work/chores Very difficult - Somewhat difficult   GAD-7 Scores:  GAD 7 : Generalized Anxiety Score 09/16/2019  Nervous, Anxious, on Edge 1  Control/stop worrying 3  Worry too much - different things 3  Trouble relaxing 3  Restless 3  Easily annoyed or irritable 3  Afraid - awful might happen 3  Total GAD 7 Score 19  Anxiety Difficulty Very difficult    Stress Current stressors: Current Stressors: Family death(Boyfriend died about a week ago, Best friend died last year) Sleep: Sleep: Nightmares, Difficulty falling asleep(Difficulty sleeping without the medications, 9:30pm/10pm ) Appetite: Appetite: Loss of appetite, Weight loss(Breakfast bars sometimes) Coping ability: Coping ability: Exhausted(Cleaning the house keeps her busy, Going to work helps) Patient taking medications as prescribed: Patient taking medications as prescribed: Yes(Ativan at night, Paxil 10 mg in the morning)  Current medications:  Outpatient Encounter  Medications as of 09/24/2019  Medication Sig  . amoxicillin (AMOXIL) 250 MG capsule Take 1 capsule (250 mg total) by mouth 2 (two) times daily.  Marland Kitchen conjugated estrogens (PREMARIN) vaginal cream Place 1 Applicatorful vaginally daily.  Marland Kitchen levothyroxine (SYNTHROID) 50 MCG tablet Take Medication first thing in the morning with water on a empty stomach  . LORazepam (ATIVAN) 0.5 MG tablet Take 1 tablet (0.5 mg total) by mouth every 6 (six) hours as needed for anxiety.  Marland Kitchen PARoxetine (PAXIL) 10 MG tablet Take 1 tablet (10 mg total) by mouth daily.  . [DISCONTINUED] estradiol (ESTRACE) 1 MG tablet Take 1 tablet (1 mg total) by mouth 2 (two) times daily.   No facility-administered encounter medications on file as of 09/24/2019.     Self-harm Behaviors Risk Assessment Patient endorses recent thoughts of harming self: Have you recently had any thoughts about harming yourself?: No   Danger to Others Risk Assessment Danger to others risk factors: Danger to Others Risk Factors: No risk factors noted Patient endorses recent thoughts of harming others: Notification required: No need or identified person Dynamic Appraisal of Situational Aggression (DASA): No flowsheet data found.    Goals, Interventions and Follow-up Plan Goals: Increased her motivation to do the things that she enjoyed before she felt depressed   Interventions: Supportive Counseling and Medication Monitoring Follow-up Plan: Continue with Virtual Behavioral Health Services  Summary:  Yailyn Makowski continues to experience complicated grief and ongoing stressors.  Ms. Holscher has been taking the Paxil and Ativan as prescribed She did  report more nightmares & vivid dream in the past few nights.  Although she has had nightmares in past, it could potentially be a side effect from the medication and will consult with psychiatrist.  Ms. Devoid will continue with [facility] services.  She plans on cleaning this weekend as a means of  distraction & self-care, with the help of her family.  Noora Locascio Francisco Capuchin, LCSW

## 2019-09-29 ENCOUNTER — Telehealth: Payer: Self-pay | Admitting: Clinical

## 2019-09-29 DIAGNOSIS — F4323 Adjustment disorder with mixed anxiety and depressed mood: Secondary | ICD-10-CM

## 2019-09-29 NOTE — BH Specialist Note (Signed)
Virtual Behavioral Health Treatment Plan Team Note  MRN: [id number] NAME: Laurie Hutchinson  DATE: 09/29/19  Start time: Start Time: 1415 End time: Stop Time: 1430 Total time: Total Time in Minutes (Visit): 15  Total number of Virtual [place] Treatment Team Plan encounters: 2/4  Treatment Team Attendees: Dr. Modesta Messing & J. Jimmye Norman, LCSW  History of the present illness Presenting Problem/Current Symptoms:  Ongoing stress & grief. Difficulty sleeping unless taking the ativan. Having vivid dreams & nightmares.  Diagnoses:    ICD-10-CM   1. Adjustment disorder with mixed anxiety and depressed mood  F43.23     Goals, Interventions and Follow-up Plan Goals: Increased her motivation to do the things that she enjoyed before she felt depressed ( about a year ago, after her friend's death)  Interventions:Grief Counseling & Medication Monitoring  Medication Management Recommendations:  Continue on same dose of both medications if she's comfortable and re-assess in 1-2 weeks.  Assess if vivid dreams are getting worse or the same.  Follow-up Plan: Continue with Virtual Behavioral Health services and consultation with psychiatrist for medication recommendations   Review relaxation skills or other coping skills for anxiety/sleep at next scheduled follow up with [name], [facility] Clinician.       Psychosocial stressors   Virtual [place] Phone Follow Up from 09/24/2019 in [place]  Family death [Boyfriend died about a week ago, Best friend died last year]  Sleep  Nightmares, Difficulty falling asleep [Difficulty sleeping without the medications, 9:30pm/10pm ]  Appetite  Loss of appetite, Weight loss [Breakfast bars sometimes]  Coping ability  Exhausted [Cleaning the house keeps her busy, Going to work helps]  Patient taking medications as prescribed  Yes [Ativan at night, Paxil 10 mg in the morning]      Screenings PHQ-9 Assessments:  Depression screen  [facility] 2/9 09/16/2019 03/18/2019 03/12/2017  Decreased Interest 3 0 3  Down, Depressed, Hopeless 3 0 3  PHQ - 2 Score 6 0 6  Altered sleeping 3 - 3  Tired, decreased energy 3 - 2  Change in appetite 3 - 2  Feeling bad or failure about yourself  3 - 0  Trouble concentrating 3 - 1  Moving slowly or fidgety/restless 1 - 0  Suicidal thoughts 1 - 0  PHQ-9 Score 23 - 14  Difficult doing work/chores Very difficult - Somewhat difficult   GAD-7 Assessments:  GAD 7 : Generalized Anxiety Score 09/16/2019  Nervous, Anxious, on Edge 1  Control/stop worrying 3  Worry too much - different things 3  Trouble relaxing 3  Restless 3  Easily annoyed or irritable 3  Afraid - awful might happen 3  Total GAD 7 Score 19  Anxiety Difficulty Very difficult    Past Medical History Past Medical History:  Diagnosis Date  . ADD (attention deficit disorder)   . ADHD   . Anemia   . Cancer (HCC)    cervical cancer  . Complication of anesthesia    unknown druf 2010 during surgery caused anaphylaxis. [facility] and were unable to obtain records form 2010 surgery. Did fine with 2018 TAH.  . Depression   . Deviated nasal septum   . GERD (gastroesophageal reflux disease)   . Heart murmur    had leaky mitral valve as child and also had rheumatic fever.  Marland Kitchen Heart palpitations   . Hypertension    with pregnancy  . Hypoglycemia   . Left leg pain   . Migraine  headache   . 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  . Panic attack   . Pneumonia   . Rheumatic fever      Allergies:  Allergies as of 09/29/2019 - Review Complete 03/18/2019  Allergen Reaction Noted  . Other Anaphylaxis and Rash 09/26/2016  . Erythromycin  12/20/2010  . Neosporin [neomycin-bacitracin zn-polymyx] Itching 09/26/2016  . Adhesive [tape] Dermatitis and Rash 03/12/2017  . Latex Rash 10/13/2018    Medication History Current medications:  Outpatient Encounter Medications as of 09/29/2019   Medication Sig  . amoxicillin (AMOXIL) 250 MG capsule Take 1 capsule (250 mg total) by mouth 2 (two) times daily.  Marland Kitchen conjugated estrogens (PREMARIN) vaginal cream Place 1 Applicatorful vaginally daily.  Marland Kitchen levothyroxine (SYNTHROID) 50 MCG tablet Take Medication first thing in the morning with water on a empty stomach  . LORazepam (ATIVAN) 0.5 MG tablet Take 1 tablet (0.5 mg total) by mouth every 6 (six) hours as needed for anxiety.  Marland Kitchen PARoxetine (PAXIL) 10 MG tablet Take 1 tablet (10 mg total) by mouth daily.  . [DISCONTINUED] estradiol (ESTRACE) 1 MG tablet Take 1 tablet (1 mg total) by mouth 2 (two) times daily.   No facility-administered encounter medications on file as of 09/29/2019.     Scribe for Treatment Team: Toney Rakes, [place]

## 2019-09-30 ENCOUNTER — Telehealth: Payer: Self-pay | Admitting: Clinical

## 2019-09-30 DIAGNOSIS — F4323 Adjustment disorder with mixed anxiety and depressed mood: Secondary | ICD-10-CM

## 2019-09-30 NOTE — BH Specialist Note (Signed)
  [place] Telephone Follow-up  MRN: [id number] NAME: TANNAZ ECKARD Date: 10/08/19  Start time: Start Time: 0837 End time: Stop Time: 0900 Total time: Total Time in Minutes (Visit): 23 Call number: Visit Number: 3- Third Visit  Reason for call today: Reason for Contact: 2 week follow-up  PHQ-9 Scores:  Depression screen [facility] 2/9 09/16/2019 03/18/2019 03/12/2017  Decreased Interest 3 0 3  Down, Depressed, Hopeless 3 0 3  PHQ - 2 Score 6 0 6  Altered sleeping 3 - 3  Tired, decreased energy 3 - 2  Change in appetite 3 - 2  Feeling bad or failure about yourself  3 - 0  Trouble concentrating 3 - 1  Moving slowly or fidgety/restless 1 - 0  Suicidal thoughts 1 - 0  PHQ-9 Score 23 - 14  Difficult doing work/chores Very difficult - Somewhat difficult   GAD-7 Scores:  GAD 7 : Generalized Anxiety Score 09/16/2019  Nervous, Anxious, on Edge 1  Control/stop worrying 3  Worry too much - different things 3  Trouble relaxing 3  Restless 3  Easily annoyed or irritable 3  Afraid - awful might happen 3  Total GAD 7 Score 19  Anxiety Difficulty Very difficult    Stress Current stressors: Current Stressors: Grief/losses Sleep: Sleep: Difficulty falling asleep(Vivid dreams & night sweats, so she stopped taking the Ativan, feels better not taking taking it) Appetite: Appetite: Other (Comment)(Eats about once a day, not much change) Coping ability: Coping ability: Resilient Patient taking medications as prescribed: Patient taking medications as prescribed: Other (Comment)(Still taking Paxil in the morning, Stopped taking Ativan at night due to concerns with night sweats & vivid dreams)  Current medications:  Outpatient Encounter Medications as of 09/30/2019  Medication Sig  . amoxicillin (AMOXIL) 250 MG capsule Take 1 capsule (250 mg total) by mouth 2 (two) times daily.  Marland Kitchen conjugated estrogens (PREMARIN) vaginal cream Place 1 Applicatorful vaginally daily.  Marland Kitchen levothyroxine  (SYNTHROID) 50 MCG tablet Take Medication first thing in the morning with water on a empty stomach  . LORazepam (ATIVAN) 0.5 MG tablet Take 1 tablet (0.5 mg total) by mouth every 6 (six) hours as needed for anxiety.  Marland Kitchen PARoxetine (PAXIL) 10 MG tablet Take 1 tablet (10 mg total) by mouth daily.  . [DISCONTINUED] estradiol (ESTRACE) 1 MG tablet Take 1 tablet (1 mg total) by mouth 2 (two) times daily.   No facility-administered encounter medications on file as of 09/30/2019.     Self-harm Behaviors Risk Assessment Self-harm risk factors: Self-harm risk factors: (Conflict with boyfriend's family) Patient endorses recent thoughts of harming self: Have you recently had any thoughts about harming yourself?: No  Goals, Interventions and Follow-up Plan Goals: Increased her motivation to do the things that she enjoyed before she felt depressed about a year ago Interventions: Medication Monitoring and Psychoeducation and/or Health Education Follow-up Plan: Continue with [place] and take medication as prescribed.  Also review relaxation strategies.  Summary: Ms. Kampf is continuing to experience grief and difficulty sleeping.  She is open to recommendations regarding medication management since she did not like the side effects of the ativan.  She is also open to trying relaxation strategies.  Wm Fruchter Francisco Capuchin, LCSW

## 2019-10-06 ENCOUNTER — Encounter (INDEPENDENT_AMBULATORY_CARE_PROVIDER_SITE_OTHER): Payer: Self-pay | Admitting: Clinical

## 2019-10-06 NOTE — BH Specialist Note (Signed)
A user error has taken place: encounter opened in error, closed for administrative reasons.

## 2019-10-08 ENCOUNTER — Telehealth: Payer: Self-pay | Admitting: Clinical

## 2019-10-08 ENCOUNTER — Telehealth (INDEPENDENT_AMBULATORY_CARE_PROVIDER_SITE_OTHER): Payer: 59 | Admitting: Clinical

## 2019-10-08 DIAGNOSIS — F4323 Adjustment disorder with mixed anxiety and depressed mood: Secondary | ICD-10-CM

## 2019-10-08 NOTE — BH Specialist Note (Signed)
Virtual Behavioral Health Treatment Plan Team Note  MRN: [id number] NAME: Laurie Hutchinson  DATE: 10/06/19  Start time: 2:30pm  End time:  2:45pm Total time:  15 min  Total number of Virtual [place] Treatment Team Plan encounters: 2/4  Treatment Team Attendees: Dr. Stephannie Li  Diagnoses: No diagnosis found.  Goals, Interventions and Follow-up Plan Goals: Increased her motivation to do the things that she enjoyed before she felt depressed ( about a year ago, after her friend's death) Interventions: Supportive & Grief Counseling Medication Monitoring  Medication Management Recommendations:   Take Paxil 10mg  at night. Re-assess after a few nights if sleep is better.  If not, recommend taking Trazadone 25 mg-50 mg at night as needed for sleep. If PCP agrees with plan, then PCP will need to prescribe the medications above.  F/U with pt regarding relaxation strategies to try at night to improve sleep.  Follow-up Plan: Continue with Virtual [place]  Need to get updated [place]  History of the present illness Presenting Problem/Current Symptoms:  Difficulty sleeping at night Side effects with the ativan at night  Psychosocial stressors   Virtual [place] [facility] Follow Up from 09/30/2019 in [place]  Current Stressors  Grief/losses  Familial Stressors  None  Sleep  Difficulty falling asleep [Vivid dreams & night sweats, so she stopped taking the Ativan, feels better not taking taking it]  Appetite  Other (Comment) [Eats about once a day, not much change]  Coping ability  Resilient  Patient taking medications as prescribed  Other (Comment) [Still taking Paxil in the morning, Stopped taking Ativan at night due to concerns with night sweats & vivid dreams]      Self-harm Behaviors Risk Assessment   Virtual BH Phone Follow Up from 09/30/2019 in [place]  Self-harm risk factors  -- [Conflict with boyfriend's  family]  Have you recently had any thoughts about harming yourself?  No      Screenings PHQ-9 Assessments:  Depression screen [facility] 2/9 09/16/2019 03/18/2019 03/12/2017  Decreased Interest 3 0 3  Down, Depressed, Hopeless 3 0 3  PHQ - 2 Score 6 0 6  Altered sleeping 3 - 3  Tired, decreased energy 3 - 2  Change in appetite 3 - 2  Feeling bad or failure about yourself  3 - 0  Trouble concentrating 3 - 1  Moving slowly or fidgety/restless 1 - 0  Suicidal thoughts 1 - 0  PHQ-9 Score 23 - 14  Difficult doing work/chores Very difficult - Somewhat difficult   GAD-7 Assessments:  GAD 7 : Generalized Anxiety Score 09/16/2019  Nervous, Anxious, on Edge 1  Control/stop worrying 3  Worry too much - different things 3  Trouble relaxing 3  Restless 3  Easily annoyed or irritable 3  Afraid - awful might happen 3  Total GAD 7 Score 19  Anxiety Difficulty Very difficult    Past Medical History Past Medical History:  Diagnosis Date  . ADD (attention deficit disorder)   . ADHD   . Anemia   . Cancer (HCC)    cervical cancer  . Complication of anesthesia    unknown druf 2010 during surgery caused anaphylaxis. [facility] and were unable to obtain records form 2010 surgery. Did fine with 2018 TAH.  . Depression   . Deviated nasal septum   . GERD (gastroesophageal reflux disease)   . Heart murmur    had leaky mitral valve  as child and also had rheumatic fever.  Marland Kitchen Heart palpitations   . Hypertension    with pregnancy  . Hypoglycemia   . Left leg pain   . Migraine headache   . 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  . Panic attack   . Pneumonia   . Rheumatic fever     Vital signs: There were no vitals filed for this visit.  Allergies:  Allergies as of 10/06/2019 - Review Complete 03/18/2019  Allergen Reaction Noted  . Other Anaphylaxis and Rash 09/26/2016  . Erythromycin  12/20/2010  . Neosporin [neomycin-bacitracin zn-polymyx]  Itching 09/26/2016  . Adhesive [tape] Dermatitis and Rash 03/12/2017  . Latex Rash 10/13/2018    Medication History Current medications:  Outpatient Encounter Medications as of 10/06/2019  Medication Sig  . amoxicillin (AMOXIL) 250 MG capsule Take 1 capsule (250 mg total) by mouth 2 (two) times daily.  Marland Kitchen conjugated estrogens (PREMARIN) vaginal cream Place 1 Applicatorful vaginally daily.  Marland Kitchen levothyroxine (SYNTHROID) 50 MCG tablet Take Medication first thing in the morning with water on a empty stomach  . LORazepam (ATIVAN) 0.5 MG tablet Take 1 tablet (0.5 mg total) by mouth every 6 (six) hours as needed for anxiety.  Marland Kitchen PARoxetine (PAXIL) 10 MG tablet Take 1 tablet (10 mg total) by mouth daily.  . [DISCONTINUED] estradiol (ESTRACE) 1 MG tablet Take 1 tablet (1 mg total) by mouth 2 (two) times daily.   No facility-administered encounter medications on file as of 10/06/2019.     Scribe for Treatment Team: Toney Rakes, [place]

## 2019-10-08 NOTE — Telephone Encounter (Signed)
TC to Ms. Triska, no answer.  This [facility] left message to call back and apologized about missed telephone appointment yesterday.    This [facility] will try to call back later to follow up with her and then [facility] will continue to follow up with Ms. Mussell.

## 2019-10-27 ENCOUNTER — Telehealth: Payer: Self-pay | Admitting: Clinical

## 2019-10-27 NOTE — Telephone Encounter (Signed)
TC to patient to check in with how she's doing.  No answer. Left message to call back with name & contact information.

## 2019-11-17 ENCOUNTER — Telehealth: Payer: Self-pay | Admitting: Licensed Clinical Social Worker

## 2019-11-17 ENCOUNTER — Telehealth (INDEPENDENT_AMBULATORY_CARE_PROVIDER_SITE_OTHER): Payer: 59 | Admitting: Licensed Clinical Social Worker

## 2019-11-17 DIAGNOSIS — F419 Anxiety disorder, unspecified: Secondary | ICD-10-CM

## 2019-11-17 DIAGNOSIS — F4323 Adjustment disorder with mixed anxiety and depressed mood: Secondary | ICD-10-CM

## 2019-11-17 DIAGNOSIS — F329 Major depressive disorder, single episode, unspecified: Secondary | ICD-10-CM

## 2019-11-17 NOTE — BH Specialist Note (Signed)
[place] Telephone Follow-up  MRN: [id number] NAME: VASHAWN RIGGEN Date: 11/17/19  Start time:  43 End time:  1125 Total time:  16min Call number:  [phone]  Reason for call today:  follow up  PHQ-9 Scores:  Depression screen [facility] 2/9 09/16/2019 03/18/2019 03/12/2017  Decreased Interest 3 0 3  Down, Depressed, Hopeless 3 0 3  PHQ - 2 Score 6 0 6  Altered sleeping 3 - 3  Tired, decreased energy 3 - 2  Change in appetite 3 - 2  Feeling bad or failure about yourself  3 - 0  Trouble concentrating 3 - 1  Moving slowly or fidgety/restless 1 - 0  Suicidal thoughts 1 - 0  PHQ-9 Score 23 - 14  Difficult doing work/chores Very difficult - Somewhat difficult   GAD-7 Scores:  GAD 7 : Generalized Anxiety Score 09/16/2019  Nervous, Anxious, on Edge 1  Control/stop worrying 3  Worry too much - different things 3  Trouble relaxing 3  Restless 3  Easily annoyed or irritable 3  Afraid - awful might happen 3  Total GAD 7 Score 19  Anxiety Difficulty Very difficult    Stress Current stressors:  grief loss Sleep:  reduced Appetite:  only eats once per day Coping ability:  overwhelmed Patient taking medications as prescribed:  Patient reports that she has been out of Paxil and Ativan for the past 2 weeks.  Reports that medication was helping  Current medications:  Outpatient Encounter Medications as of 11/17/2019  Medication Sig  . amoxicillin (AMOXIL) 250 MG capsule Take 1 capsule (250 mg total) by mouth 2 (two) times daily.  Marland Kitchen conjugated estrogens (PREMARIN) vaginal cream Place 1 Applicatorful vaginally daily.  Marland Kitchen levothyroxine (SYNTHROID) 50 MCG tablet Take Medication first thing in the morning with water on a empty stomach  . LORazepam (ATIVAN) 0.5 MG tablet Take 1 tablet (0.5 mg total) by mouth every 6 (six) hours as needed for anxiety.  Marland Kitchen PARoxetine (PAXIL) 10 MG tablet Take 1 tablet (10 mg total) by mouth daily.  . [DISCONTINUED] estradiol (ESTRACE) 1 MG tablet  Take 1 tablet (1 mg total) by mouth 2 (two) times daily.   No facility-administered encounter medications on file as of 11/17/2019.     Self-harm Behaviors Risk Assessment Self-harm risk factors:  denies SI Patient endorses recent thoughts of harming self:    [place] Suicide Severity Rating Scale: No flowsheet data found. No flowsheet data found.   Danger to Others Risk Assessment Danger to others risk factors:  denies HI Patient endorses recent thoughts of harming others:    Dynamic Appraisal of Situational Aggression (DASA): No flowsheet data found.   Substance Use Assessment Patient recently consumed alcohol:    Alcohol Use Disorder Identification Test (AUDIT): No flowsheet data found. Patient recently used drugs:    Opioid Risk Assessment:    Goals, Interventions and Follow-up Plan Goals: Begin healthy grieving over loss Interventions: Solution-Focused Strategies Follow-up Plan: Continue with VBH; every 2 weeks  Summary: Julie-Anne is a 39 year old woman that currently attends [facility].  Jesslyn reports that she enjoys VBH and wants to continue with twice month calls.  She reports that she continues to struggle with depression and anxiety symptoms (sadness, poor appetite, poor sleep, lack of motivation, worried most days, on edge).  Dawnette reprots current coping mechanism is to "work a lot."  Completed GAD7 and PHQ9 while in session and discussed alternate coping strategies. Patient reports that she is  out of medication (Paxil and Ativan) for the past 2 weeks and needs refill.   [name], LCSW

## 2019-11-17 NOTE — BH Specialist Note (Signed)
[place] Telephone Follow-up  MRN: [id number] NAME: Laurie Hutchinson Date: 11/17/19  Start time:  5 End time:  1125 Total time:  37min Call number:  [phone]  Reason for call today:  follow up  PHQ-9 Scores:  Depression screen [facility] 2/9 11/17/2019 09/16/2019 03/18/2019 03/12/2017  Decreased Interest 1 3 0 3  Down, Depressed, Hopeless 3 3 0 3  PHQ - 2 Score 4 6 0 6  Altered sleeping 3 3 - 3  Tired, decreased energy 2 3 - 2  Change in appetite 2 3 - 2  Feeling bad or failure about yourself  2 3 - 0  Trouble concentrating 3 3 - 1  Moving slowly or fidgety/restless 2 1 - 0  Suicidal thoughts 0 1 - 0  PHQ-9 Score 18 23 - 14  Difficult doing work/chores Somewhat difficult Very difficult - Somewhat difficult   GAD-7 Scores:  GAD 7 : Generalized Anxiety Score 11/17/2019 09/16/2019  Nervous, Anxious, on Edge 2 1  Control/stop worrying 3 3  Worry too much - different things 3 3  Trouble relaxing 2 3  Restless 2 3  Easily annoyed or irritable 2 3  Afraid - awful might happen 3 3  Total GAD 7 Score 17 19  Anxiety Difficulty Somewhat difficult Very difficult    Stress Current stressors:  grief loss Sleep:  reduced Appetite:  only eats once per day Coping ability:  overwhelmed Patient taking medications as prescribed:  Patient reports that she has been out of Paxil and Ativan for the past 2 weeks.  Reports that medication was helping  Current medications:  Outpatient Encounter Medications as of 11/17/2019  Medication Sig  . amoxicillin (AMOXIL) 250 MG capsule Take 1 capsule (250 mg total) by mouth 2 (two) times daily.  Marland Kitchen conjugated estrogens (PREMARIN) vaginal cream Place 1 Applicatorful vaginally daily.  Marland Kitchen levothyroxine (SYNTHROID) 50 MCG tablet Take Medication first thing in the morning with water on a empty stomach  . LORazepam (ATIVAN) 0.5 MG tablet Take 1 tablet (0.5 mg total) by mouth every 6 (six) hours as needed for anxiety.  Marland Kitchen PARoxetine (PAXIL) 10 MG tablet  Take 1 tablet (10 mg total) by mouth daily.  . [DISCONTINUED] estradiol (ESTRACE) 1 MG tablet Take 1 tablet (1 mg total) by mouth 2 (two) times daily.   No facility-administered encounter medications on file as of 11/17/2019.     Self-harm Behaviors Risk Assessment Self-harm risk factors:  denies SI Patient endorses recent thoughts of harming self:    [place] Suicide Severity Rating Scale: No flowsheet data found. No flowsheet data found.   Danger to Others Risk Assessment Danger to others risk factors:  denies HI Patient endorses recent thoughts of harming others:    Dynamic Appraisal of Situational Aggression (DASA): No flowsheet data found.   Substance Use Assessment Patient recently consumed alcohol:    Alcohol Use Disorder Identification Test (AUDIT): No flowsheet data found. Patient recently used drugs:    Opioid Risk Assessment:    Goals, Interventions and Follow-up Plan Goals: Begin healthy grieving over loss Interventions: Solution-Focused Strategies Follow-up Plan: Continue with VBH; every 2 weeks  Summary: Laurie Hutchinson is a 39 year old woman that currently attends [facility].  Laurie Hutchinson reports that she enjoys VBH and wants to continue with twice month calls.  She reports that she continues to struggle with depression and anxiety symptoms (sadness, poor appetite, poor sleep, lack of motivation, worried most days, on edge).  Laurie Hutchinson reprots current  coping mechanism is to "work a lot."  Completed GAD7 and PHQ9 while in session and discussed alternate coping strategies. Patient reports that she is out of medication (Paxil and Ativan) for the past 2 weeks and needs refill.   [name], LCSW

## 2019-12-08 ENCOUNTER — Telehealth (INDEPENDENT_AMBULATORY_CARE_PROVIDER_SITE_OTHER): Payer: Self-pay | Admitting: Licensed Clinical Social Worker

## 2019-12-08 DIAGNOSIS — F4323 Adjustment disorder with mixed anxiety and depressed mood: Secondary | ICD-10-CM

## 2019-12-08 DIAGNOSIS — F329 Major depressive disorder, single episode, unspecified: Secondary | ICD-10-CM

## 2019-12-08 DIAGNOSIS — F419 Anxiety disorder, unspecified: Secondary | ICD-10-CM

## 2019-12-08 DIAGNOSIS — F32A Depression, unspecified: Secondary | ICD-10-CM

## 2019-12-08 NOTE — Telephone Encounter (Signed)
Left message encouraging contact 

## 2020-04-23 IMAGING — DX CHEST - 2 VIEW
2 series · 2 of 2 positions shown · non-contrast
Comparison: 06/03/2017

CLINICAL DATA: Fever and cough

EXAM:
CHEST - 2 VIEW

[chest pa]
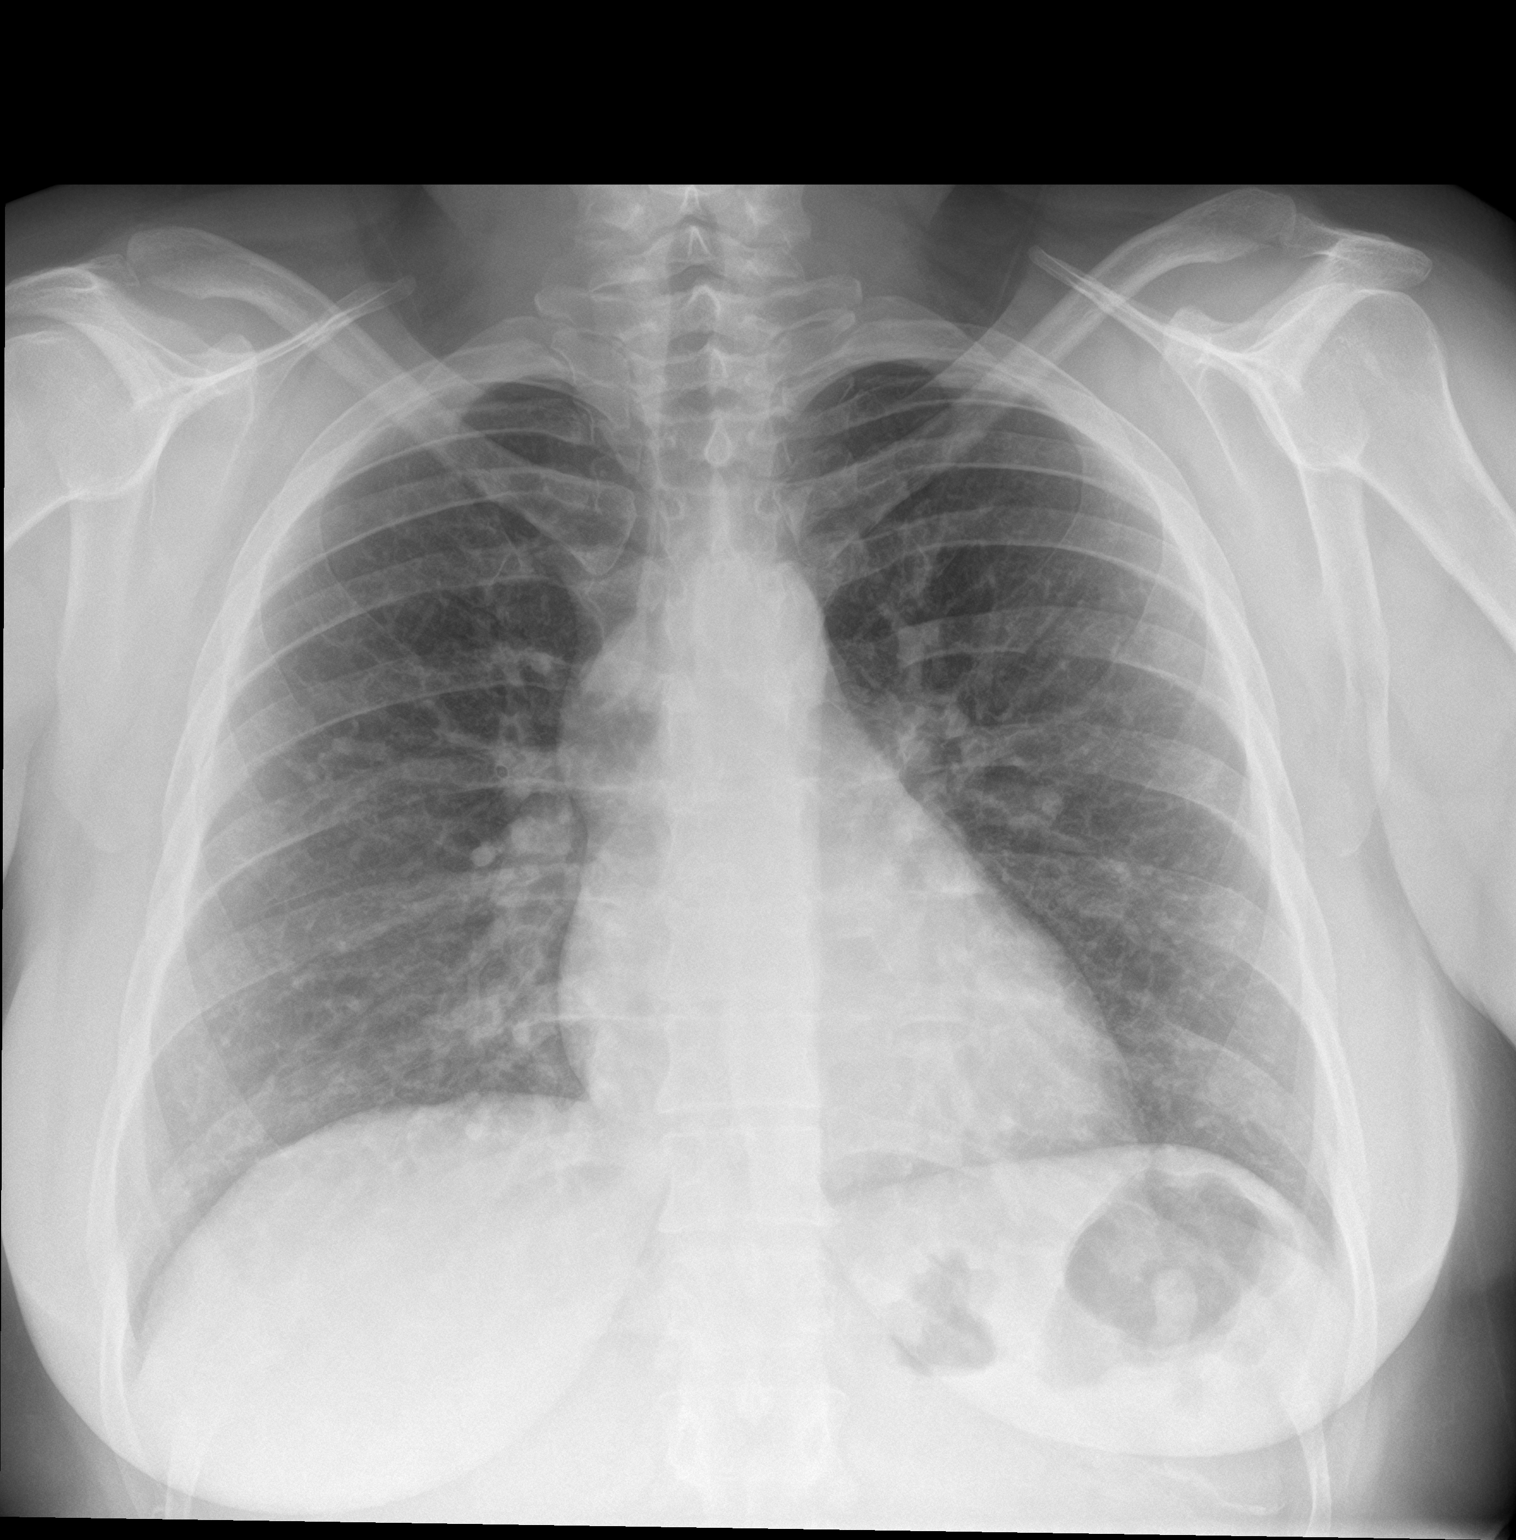

[chest lat]
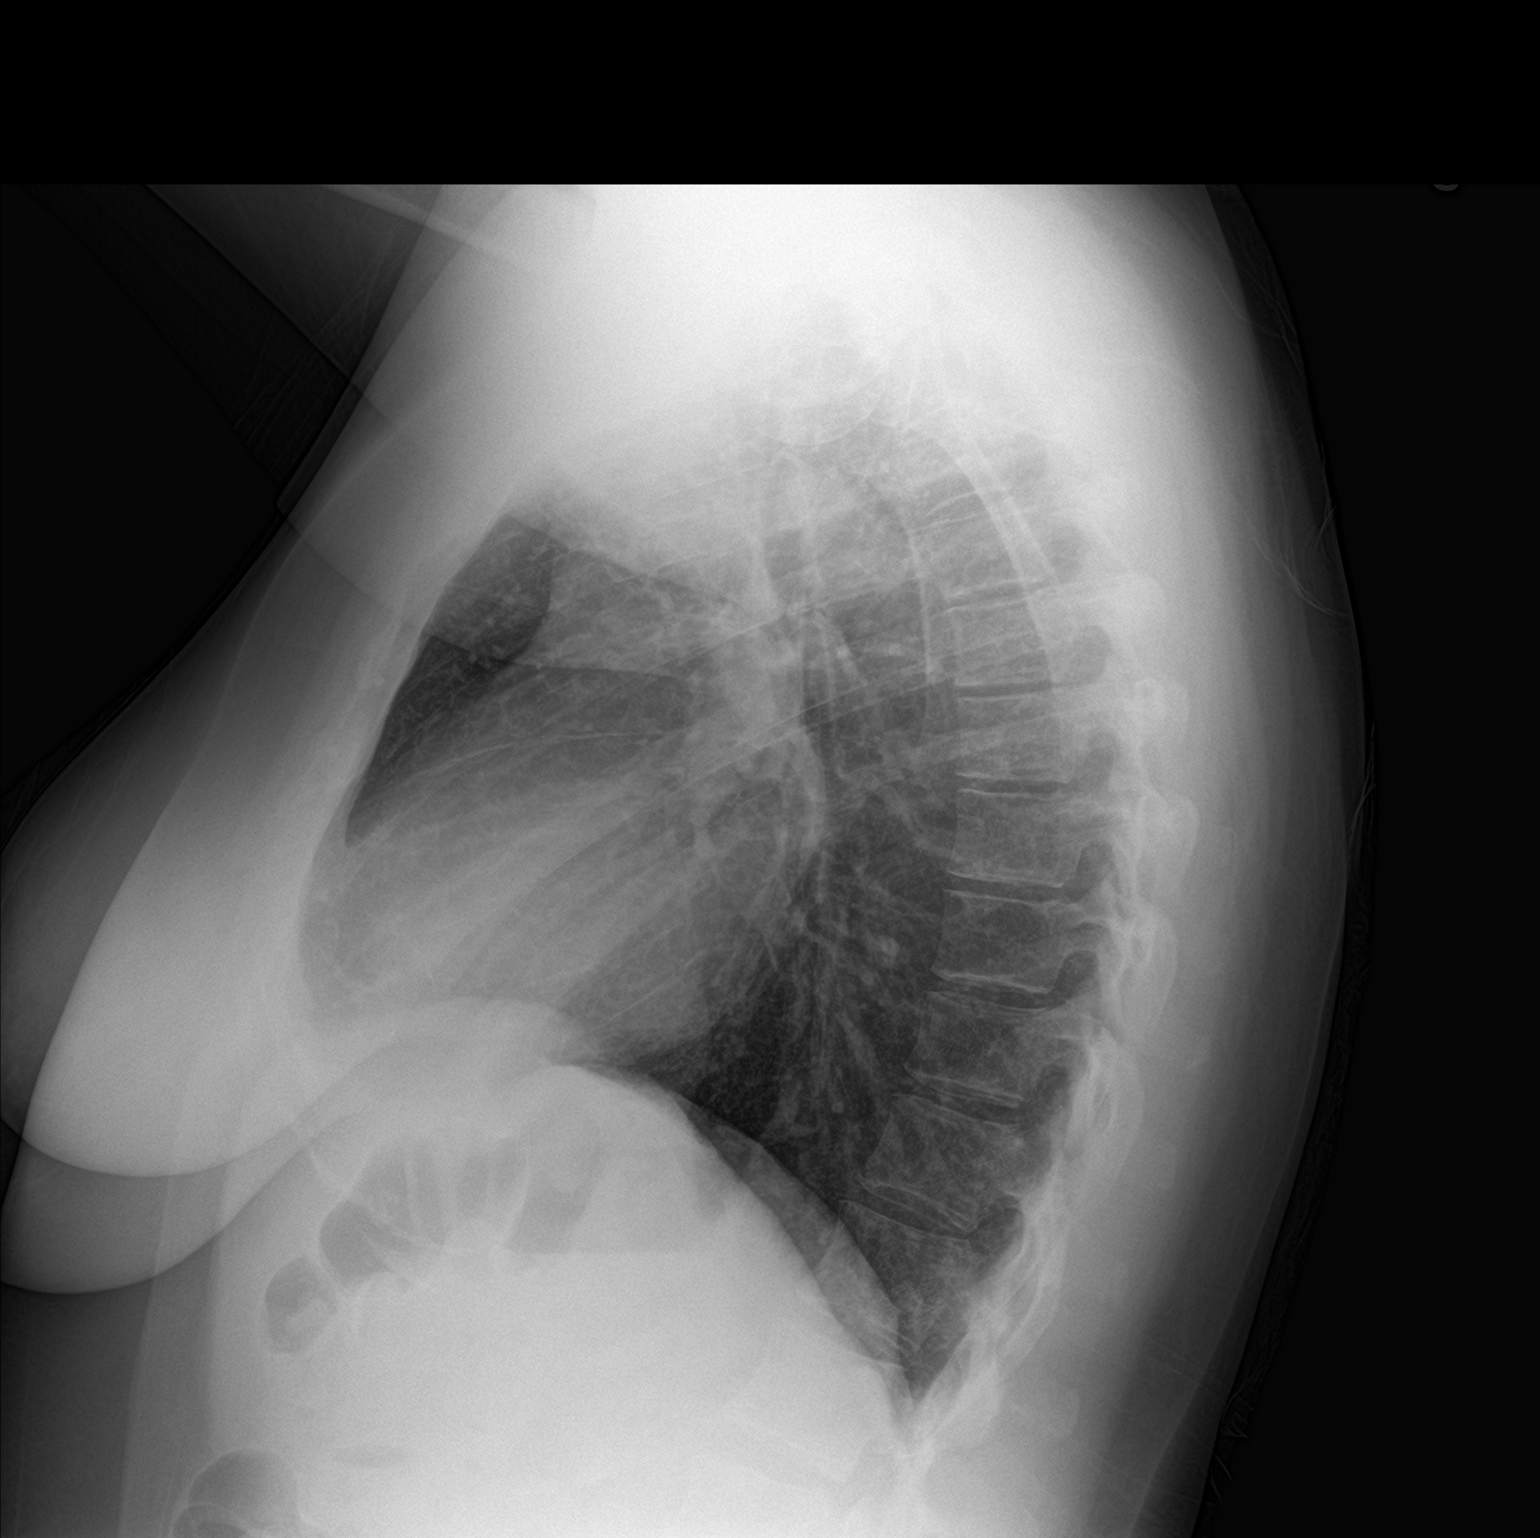

[2 of 2 positions shown; findings below may reference images not displayed]

FINDINGS: No focal airspace disease or effusion. Normal heart size. No
pneumothorax. Convex hilar fullness on the right
IMPRESSION: 1. No acute airspace disease
2. Convex fullness at the right hilar region, possible
lymphadenopathy. Contrast-enhanced CT may be obtained if deemed
clinically appropriate

## 2020-05-02 ENCOUNTER — Ambulatory Visit: Payer: 59 | Admitting: Internal Medicine

## 2020-05-02 ENCOUNTER — Other Ambulatory Visit: Payer: Self-pay

## 2020-05-05 ENCOUNTER — Emergency Department (HOSPITAL_COMMUNITY)
Admission: EM | Admit: 2020-05-05 | Discharge: 2020-05-05 | Disposition: A | Discharge status: Home / Self Care | Payer: 59 | Attending: Emergency Medicine | Admitting: Emergency Medicine

## 2020-05-05 ENCOUNTER — Encounter (HOSPITAL_COMMUNITY): Payer: Self-pay | Admitting: Emergency Medicine

## 2020-05-05 ENCOUNTER — Other Ambulatory Visit: Payer: Self-pay

## 2020-05-05 DIAGNOSIS — R0602 Shortness of breath: Secondary | ICD-10-CM | POA: Insufficient documentation

## 2020-05-05 DIAGNOSIS — R519 Headache, unspecified: Secondary | ICD-10-CM | POA: Insufficient documentation

## 2020-05-05 DIAGNOSIS — Z5321 Procedure and treatment not carried out due to patient leaving prior to being seen by health care provider: Secondary | ICD-10-CM | POA: Diagnosis not present

## 2020-05-05 DIAGNOSIS — J029 Acute pharyngitis, unspecified: Secondary | ICD-10-CM | POA: Diagnosis not present

## 2020-05-05 NOTE — ED Triage Notes (Signed)
Pt states that this morning she woke up with a headache, sore throat, and some slight shortness of breath. Pt states her daughter is positive for COVID.

## 2020-05-09 ENCOUNTER — Ambulatory Visit: Payer: 59 | Admitting: Internal Medicine

## 2020-06-01 ENCOUNTER — Ambulatory Visit: Payer: 59 | Admitting: Internal Medicine

## 2020-08-03 ENCOUNTER — Encounter: Payer: Self-pay | Admitting: Emergency Medicine

## 2020-08-03 ENCOUNTER — Ambulatory Visit
Admission: EM | Admit: 2020-08-03 | Discharge: 2020-08-03 | Disposition: A | Discharge status: Home / Self Care | Payer: 59 | Attending: Emergency Medicine | Admitting: Emergency Medicine

## 2020-08-03 DIAGNOSIS — R350 Frequency of micturition: Secondary | ICD-10-CM | POA: Diagnosis not present

## 2020-08-03 DIAGNOSIS — N390 Urinary tract infection, site not specified: Secondary | ICD-10-CM | POA: Insufficient documentation

## 2020-08-03 DIAGNOSIS — R300 Dysuria: Secondary | ICD-10-CM | POA: Diagnosis not present

## 2020-08-03 LAB — POCT URINALYSIS DIP (MANUAL ENTRY)
Bilirubin, UA: NEGATIVE
Glucose, UA: NEGATIVE mg/dL
Ketones, POC UA: NEGATIVE mg/dL
Nitrite, UA: NEGATIVE
Protein Ur, POC: NEGATIVE mg/dL
Spec Grav, UA: 1.025 (ref 1.010–1.025)
Urobilinogen, UA: 0.2 E.U./dL
pH, UA: 7 (ref 5.0–8.0)

## 2020-08-03 MED ORDER — PHENAZOPYRIDINE HCL 100 MG PO TABS: 100.0000 mg | ORAL_TABLET | Freq: Three times a day (TID) | ORAL | 0 refills | Status: DC | PRN

## 2020-08-03 MED ORDER — NITROFURANTOIN MONOHYD MACRO 100 MG PO CAPS: 100.0000 mg | ORAL_CAPSULE | Freq: Two times a day (BID) | ORAL | 0 refills | Status: DC

## 2020-08-03 NOTE — ED Provider Notes (Addendum)
[place]   Chief Complaint  Patient presents with   Urinary Tract Infection     SUBJECTIVE:  Laurie Hutchinson is a 40 y.o. female who presented to the urgent care with a complaint of dysuria and urine frequency that started yesterday.  Patient denies a precipitating event, recent sexual encounter, excessive caffeine intake.  Denies abdomen/flank pain.  Has tried OTC medications without relief.  Symptoms are made worse with urination.  Admits to similar symptoms in the past.  Denies fever, chills, nausea, vomiting, abdominal pain, flank pain, abnormal vaginal discharge or bleeding, hematuria.    LMP: Patient's last menstrual period was 10/04/2016 (exact date).  ROS: As in HPI.  All other pertinent ROS negative.     Past Medical History:  Diagnosis Date   ADD (attention deficit disorder)    ADHD    Anemia    Cancer ([place])    cervical cancer   Complication of anesthesia    unknown druf 2010 during surgery caused anaphylaxis. [facility] and were unable to obtain records form 2010 surgery. Did fine with 2018 TAH.   Depression    Deviated nasal septum    GERD (gastroesophageal reflux disease)    Heart murmur    had leaky mitral valve as child and also had rheumatic fever.   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   Panic attack    Pneumonia    Rheumatic fever    Past Surgical History:  Procedure Laterality Date   ABDOMINAL HYSTERECTOMY     COLPOSCOPY     CYSTOSCOPY N/A 10/09/2016   Procedure: CYSTOSCOPY;  Surgeon: Sanjuana Kava, MD;  Location: [facility];  Service: Gynecology;  Laterality: N/A;   DILATION AND CURETTAGE OF UTERUS     MASS EXCISION Left 03/26/2017   Procedure: EXCISION SOFT TISSUE MASS LEFT FOOT;  Surgeon: Caprice Beaver, DPM;  Location: [facility];  Service:  Podiatry;  Laterality: Left;   MASS EXCISION Right 03/26/2017   Procedure: EXCISION SOFT TISSUE MASS 2ND TOE RIGHT FOOT;  Surgeon: Caprice Beaver, DPM;  Location: [facility];  Service: Podiatry;  Laterality: Right;   NASAL SEPTUM SURGERY     TOTAL LAPAROSCOPIC HYSTERECTOMY WITH SALPINGECTOMY Bilateral 10/09/2016   Procedure: HYSTERECTOMY TOTAL LAPAROSCOPIC WITH SALPINGECTOMY;  Surgeon: Sanjuana Kava, MD;  Location: [facility];  Service: Gynecology;  Laterality: Bilateral;   TUBAL LIGATION     Allergies  Allergen Reactions   Other Anaphylaxis and Rash    Not sure of drug name but it was given to her during surgery in 2010   Erythromycin     Abdominal cramping   Neosporin [Neomycin-Bacitracin Zn-Polymyx] Itching   Adhesive [Tape] Dermatitis and Rash    "Band-Aids" - Name Brand     Latex Rash   No current facility-administered medications on file prior to encounter.   Current Outpatient Medications on File Prior to Encounter  Medication Sig Dispense Refill   amoxicillin (AMOXIL) 250 MG capsule Take 1 capsule (250 mg total) by mouth 2 (two) times daily. 10 capsule 0   conjugated estrogens (PREMARIN) vaginal cream Place 1 Applicatorful vaginally daily. 42.5 g 12   levothyroxine (SYNTHROID) 50 MCG tablet Take Medication first thing in the morning with water on a empty stomach 30 tablet 1   LORazepam (ATIVAN)  0.5 MG tablet Take 1 tablet (0.5 mg total) by mouth every 6 (six) hours as needed for anxiety. 30 tablet 1   PARoxetine (PAXIL) 10 MG tablet Take 1 tablet (10 mg total) by mouth daily. 30 tablet 1   [DISCONTINUED] estradiol (ESTRACE) 1 MG tablet Take 1 tablet (1 mg total) by mouth 2 (two) times daily. 60 tablet 1   Social History   Socioeconomic History   Marital status: Single    Spouse name: Not on file   Number of children: 3   Years of education: Associates   Highest education level: Not on file  Occupational History   Occupation: Secondary school teacher  Tobacco Use    Smoking status: Light Tobacco Smoker    Packs/day: 0.50    Years: 20.00    Pack years: 10.00    Types: Cigarettes   Smokeless tobacco: Never Used  Scientific laboratory technician Use: Every day   Substances: Nicotine  Substance and Sexual Activity   Alcohol use: Yes    Comment: social   Drug use: No   Sexual activity: Yes    Birth control/protection: None  Other Topics Concern   Not on file  Social History Narrative   Lives at home with daughter.   Right-handed.   2-3 cups caffeine per day.   Engaged.    Currently not able to work b/c of leg pain.    Social Determinants of Health   Financial Resource Strain: Not on file  Food Insecurity: Not on file  Transportation Needs: Not on file  Physical Activity: Not on file  Stress: Not on file  Social Connections: Not on file  Intimate Partner Violence: Not on file   Family History  Problem Relation Age of Onset   Hypertension Mother    Thyroid disease Mother    Heart disease Father    Heart attack Maternal Grandmother    Heart attack Maternal Grandfather    Heart attack Paternal Grandmother    Heart attack Paternal Grandfather    Alcohol abuse Brother    ADD / ADHD Daughter    Strabismus Daughter    Migraines Daughter    Alcohol abuse Brother     OBJECTIVE:  Vitals:   08/03/20 1146  BP: (!) 146/96  Pulse: 100  Resp: 16  Temp: 98.2 F (36.8 C)  SpO2: 98%   General appearance: AOx3 in no acute distress HEENT: NCAT.  Oropharynx clear.  Lungs: clear to auscultation bilaterally without adventitious breath sounds Heart: regular rate and rhythm.  Radial pulses 2+ symmetrical bilaterally Abdomen: soft; non-distended; no tenderness; bowel sounds present; no guarding or rebound tenderness Back: no CVA tenderness Extremities: no edema; symmetrical with no gross deformities Skin: warm and dry Neurologic: Ambulates from chair to exam table without difficulty Psychological: alert and cooperative; normal  mood and affect  Labs Reviewed  POCT URINALYSIS DIP (MANUAL ENTRY) - Abnormal; Notable for the following components:      Result Value   Blood, UA trace-intact (*)    Leukocytes, UA Small (1+) (*)    All other components within normal limits  URINE CULTURE    ASSESSMENT & PLAN:  1. Dysuria   2. Urine frequency   3. Acute lower UTI     Meds ordered this encounter  Medications   nitrofurantoin, macrocrystal-monohydrate, (MACROBID) 100 MG capsule    Sig: Take 1 capsule (100 mg total) by mouth 2 (two) times daily.    Dispense:  10 capsule    Refill:  0   Discharge instructions  Urine culture sent.  We will call you with the results.   Push fluids and get plenty of rest.   Take antibiotic as directed and to completion Take medication as prescribed and to completion Follow up with PCP if symptoms persists Return here or go to ER if you have any new or worsening symptoms such as fever, worsening abdominal pain, nausea/vomiting, flank pain, etc..  Outlined signs and symptoms indicating need for more acute intervention. Patient verbalized understanding. After Visit Summary given.     Emerson Monte, FNP 08/03/20 1216    Emerson Monte, FNP 08/03/20 1217

## 2020-08-03 NOTE — ED Triage Notes (Signed)
Patient states that she has burning with urination, frequency since yesterday and foul odor when urinating.

## 2020-08-03 NOTE — Discharge Instructions (Signed)
  Urine culture sent.  We will call you with the results.   Push fluids and get plenty of rest.   Take antibiotic as directed and to completion Take medication as prescribed and to completion Follow up with PCP if symptoms persists Return here or go to ER if you have any new or worsening symptoms such as fever, worsening abdominal pain, nausea/vomiting, flank pain, etc..

## 2020-08-06 LAB — URINE CULTURE
Culture: >=100000 — AB
Special Requests: NORMAL

## 2023-01-13 ENCOUNTER — Telehealth: Payer: Self-pay | Admitting: *Deleted

## 2023-01-13 NOTE — Telephone Encounter (Signed)
OUD Intake:  Drug of choice: Percocet  How long: Started in 2018 after TAH that nicked femoral nerve. Has had multiple surgeries since then.  Other substances: Lortab  Currently on suboxone: Yes, has bought strips off the street x 2 years. Takes 1 strip BID and this curtails cravings.  Interested in treatment: Yes. She was unaware that suboxone could be prescribed by a Physician's office prior to this.  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

## 2023-01-20 ENCOUNTER — Encounter: Payer: Self-pay | Admitting: Student

## 2023-01-20 ENCOUNTER — Ambulatory Visit: Payer: 59 | Admitting: Student

## 2023-01-20 ENCOUNTER — Other Ambulatory Visit: Payer: Self-pay

## 2023-01-20 VITALS — BP 155/96 | HR 85 | Temp 98.2°F | Resp 28 | Ht 63.0 in | Wt 229.9 lb

## 2023-01-20 DIAGNOSIS — E039 Hypothyroidism, unspecified: Secondary | ICD-10-CM | POA: Diagnosis not present

## 2023-01-20 DIAGNOSIS — Z0000 Encounter for general adult medical examination without abnormal findings: Secondary | ICD-10-CM | POA: Insufficient documentation

## 2023-01-20 DIAGNOSIS — F1191 Opioid use, unspecified, in remission: Secondary | ICD-10-CM | POA: Diagnosis not present

## 2023-01-20 DIAGNOSIS — F1121 Opioid dependence, in remission: Secondary | ICD-10-CM | POA: Insufficient documentation

## 2023-01-20 DIAGNOSIS — K219 Gastro-esophageal reflux disease without esophagitis: Secondary | ICD-10-CM | POA: Insufficient documentation

## 2023-01-20 MED ORDER — BUPRENORPHINE HCL-NALOXONE HCL 8-2 MG SL FILM: 1.0000 | ORAL_FILM | Freq: Two times a day (BID) | SUBLINGUAL | 0 refills | Status: DC

## 2023-01-20 NOTE — Assessment & Plan Note (Addendum)
Overview: Pt first took narcotic pain medicine in 2018 when she had a hysterectomy and two foot surgeries. Drug of choice percocet provided from pain clinic that she attended until approx 2021. Around that time, she noticed her dependence due to increased and frequent cravings and reduced effect but has been unable to stop. After, she relied on percocet and suboxone obtained from outside the healthcare system and continues to do so, causing a financial strain. She has not consumed other drugs, only ever percocet or suboxone po. Most recent percocet was 1-2 months ago. Does report associated constipation. Experiences withdrawal symptoms of anxiety, GI distress, insomnia, sweating. Reports that her family relationships have been strained due to this issue. Today she is very determined to address this issue. PLAN: Starting suboxone today 8mg /2mg  BID, the amount she has self-medicated with over the last several months. Agreement signed. [facility] at next visit.

## 2023-01-20 NOTE — Patient Instructions (Addendum)
Laurie Hutchinson,  Thank you for coming in today! Today we will start your suboxone prescription 8mg /2mg  dose in the morning and evening. Please return in 1 month to go over your suboxone and address your other medical conditions.  Reach out if you need any help in the meantime. Best regards, Dr. Katheran James

## 2023-01-20 NOTE — Progress Notes (Addendum)
CC: Initiation of suboxone  HPI:  [name] is a 42 y.o. female living with a history stated below and presents today to initiate suboxone treatment. She is new to this clinic and she is agreeable to making [facility] her PCP. Please see problem based assessment and plan for additional details.    Past Medical History:  Diagnosis Date   ADD (attention deficit disorder)    ADHD    Anemia    Cancer (HCC)    cervical cancer   Complication of anesthesia    unknown druf 2010 during surgery caused anaphylaxis. Had TAH at [facility] 2018 and were unable to obtain records form 2010 surgery. Did fine with 2018 TAH.   Depression    Deviated nasal septum    GERD (gastroesophageal reflux disease)    Heart murmur    had leaky mitral valve as child and also had rheumatic fever.   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   Panic attack    Pneumonia    Rheumatic fever     Current Outpatient Medications on File Prior to Visit  Medication Sig Dispense Refill   conjugated estrogens (PREMARIN) vaginal cream Place 1 Applicatorful vaginally daily. 42.5 g 12   levothyroxine (SYNTHROID) 50 MCG tablet Take Medication first thing in the morning with water on a empty stomach 30 tablet 1   phenazopyridine (PYRIDIUM) 100 MG tablet Take 1 tablet (100 mg total) by mouth 3 (three) times daily as needed for pain. 10 tablet 0   [DISCONTINUED] estradiol (ESTRACE) 1 MG tablet Take 1 tablet (1 mg total) by mouth 2 (two) times daily. 60 tablet 1   No current facility-administered medications on file prior to visit.    Family History  Problem Relation Age of Onset   Hypertension Mother    Thyroid disease Mother    Heart disease Father    Heart attack Maternal Grandmother    Heart attack Maternal Grandfather    Heart attack Paternal Grandmother     Heart attack Paternal Grandfather    Alcohol abuse Brother    ADD / ADHD Daughter    Strabismus Daughter    Migraines Daughter    Alcohol abuse Brother     Social History   Socioeconomic History   Marital status: Single    Spouse name: Not on file   Number of children: 3   Years of education: Associates   Highest education level: Not on file  Occupational History   Occupation: Investment banker, corporate  Tobacco Use   Smoking status: Light Smoker    Packs/day: 0.50    Years: 20.00    Additional pack years: 0.00    Total pack years: 10.00    Types: Cigarettes, E-cigarettes   Smokeless tobacco: Never   Tobacco comments:    Vapes  1-2 cigs per month.  Vaping Use   Vaping Use: Every day   Substances: Nicotine  Substance and Sexual Activity   Alcohol use: Yes    Comment: social   Drug use: No   Sexual activity: Not Currently    Birth control/protection: None  Other Topics Concern   Not on file  Social History Narrative   Lives at home with daughter.   Right-handed.   2-3 cups caffeine per day.   Was engaged but  lost fiance in car wreck in early 2020s.   Social Determinants of Health   Financial Resource Strain: Not on file  Food Insecurity: Not on file  Transportation Needs: Not on file  Physical Activity: Not on file  Stress: Not on file  Social Connections: Not on file  Intimate Partner Violence: Not on file    Review of Systems: ROS negative except for what is noted on the assessment and plan.  Vitals:   01/20/23 1400  BP: (!) 155/96  Pulse: 85  Resp: (!) 28  Temp: 98.2 F (36.8 C)  TempSrc: Oral  SpO2: 98%  Weight: 229 lb 14.4 oz (104.3 kg)  Height: 5\' 3"  (1.6 m)    Physical Exam: Constitutional: well-appearing female sitting in chair, in no acute distress HENT: normocephalic atraumatic, mucous membranes moist Eyes: conjunctiva non-erythematous Cardiovascular: regular rate and rhythm, no m/r/g Pulmonary/Chest: normal work of breathing on room air,  lungs clear to auscultation bilaterally Abdominal: soft, non-tender, non-distended MSK: normal bulk and tone Neurological: alert & oriented x 3, no focal deficit Skin: warm and dry Psych: normal mood and behavior  Assessment & Plan:    Patient seen with Dr. Antony Contras  Moderate opioid use disorder in early remission on maintenance therapy [facility]) Overview: Pt first took narcotic pain medicine in 2018 when she had a hysterectomy and two foot surgeries. Drug of choice percocet provided from pain clinic that she attended until approx 2021. Around that time, she noticed her dependence due to increased and frequent cravings and reduced effect but has been unable to stop. After, she relied on percocet and suboxone obtained from outside the healthcare system and continues to do so, causing a financial strain. She has not consumed other drugs, only ever percocet or suboxone po. Most recent percocet was 1-2 months ago. Does report associated constipation. Experiences withdrawal symptoms of anxiety, GI distress, insomnia, sweating. Reports that her family relationships have been strained due to this issue. Today she is very determined to address this issue. PLAN: Starting suboxone today 8mg /2mg  BID, the amount she has self-medicated with over the last several months. Agreement signed. Toxassure at next visit.  Health care maintenance Ordering mammogram today. Pt says she is up-to-date on pap, will request records from previous PCP.  Hypothyroidism History of hypothyroidism currently on synthroid. Continue for now, will request labs from previous PCP.  Return in 1 month to evaluate suboxone therapy/toxassure, also evaluate BP. Will request documents from current PCP and establish baseline labs for general care if needed.  She has an upcoming appointment with rheumatology due to +ANA, +anti-CCP, +anti-TPO  Katheran James, D.O. [facility] Internal Medicine, PGY-1 Phone: [phone] Date 01/20/2023  Time 5:03 PM

## 2023-01-20 NOTE — Assessment & Plan Note (Addendum)
History of hypothyroidism currently on synthroid. Continue for now, will request labs from previous PCP.

## 2023-01-20 NOTE — Assessment & Plan Note (Signed)
Ordering mammogram today. Pt says she is up-to-date on pap, will request records from previous PCP.

## 2023-01-29 NOTE — Progress Notes (Signed)
Internal Medicine Clinic Attending  I was physically present during the key portions of the resident provided service and participated in the medical decision making of patient's management care. I reviewed pertinent patient test results.  The assessment, diagnosis, and plan were formulated together and I agree with the documentation in the resident's note.  Guilloud, Carolyn, MD  

## 2023-01-30 ENCOUNTER — Other Ambulatory Visit: Payer: Self-pay | Admitting: Student

## 2023-01-30 DIAGNOSIS — E039 Hypothyroidism, unspecified: Secondary | ICD-10-CM

## 2023-01-30 DIAGNOSIS — R3981 Functional urinary incontinence: Secondary | ICD-10-CM

## 2023-02-18 ENCOUNTER — Ambulatory Visit: Payer: 59 | Admitting: Student

## 2023-02-18 ENCOUNTER — Encounter: Payer: Self-pay | Admitting: Student

## 2023-02-18 VITALS — BP 134/84 | HR 70 | Temp 97.8°F | Ht 63.0 in | Wt 230.3 lb

## 2023-02-18 DIAGNOSIS — I10 Essential (primary) hypertension: Secondary | ICD-10-CM

## 2023-02-18 DIAGNOSIS — R61 Generalized hyperhidrosis: Secondary | ICD-10-CM

## 2023-02-18 DIAGNOSIS — K219 Gastro-esophageal reflux disease without esophagitis: Secondary | ICD-10-CM | POA: Diagnosis not present

## 2023-02-18 DIAGNOSIS — R7303 Prediabetes: Secondary | ICD-10-CM | POA: Diagnosis not present

## 2023-02-18 DIAGNOSIS — E039 Hypothyroidism, unspecified: Secondary | ICD-10-CM | POA: Diagnosis not present

## 2023-02-18 DIAGNOSIS — F1111 Opioid abuse, in remission: Secondary | ICD-10-CM

## 2023-02-18 DIAGNOSIS — F1121 Opioid dependence, in remission: Secondary | ICD-10-CM

## 2023-02-18 LAB — GLUCOSE, CAPILLARY: Glucose-Capillary: 102 mg/dL — ABNORMAL HIGH (ref 70–99)

## 2023-02-18 LAB — POCT GLYCOSYLATED HEMOGLOBIN (HGB A1C): Hemoglobin A1C: 5.7 % — AB (ref 4.0–5.6)

## 2023-02-18 MED ORDER — LEVOTHYROXINE SODIUM 125 MCG PO TABS
125.0000 ug | ORAL_TABLET | Freq: Every day | ORAL | 11 refills | Status: AC
Start: 2023-02-18 — End: 2024-02-18

## 2023-02-18 MED ORDER — PANTOPRAZOLE SODIUM 40 MG PO TBEC: 40.0000 mg | DELAYED_RELEASE_TABLET | Freq: Every day | ORAL | 1 refills | Status: DC

## 2023-02-18 MED ORDER — LOSARTAN POTASSIUM 25 MG PO TABS: 25.0000 mg | ORAL_TABLET | Freq: Every day | ORAL | 2 refills | Status: DC

## 2023-02-18 NOTE — Assessment & Plan Note (Signed)
She presents with a history of heartburn that has been well controlled on protonix 40mg  daily. She reports heartburn symptoms if she misses a dose. PLAN: -Refill Protonix 40 mg

## 2023-02-18 NOTE — Assessment & Plan Note (Addendum)
She presents with a history of prediabetes. Her last A1c check was in 2023 and measured at 5.8%. She reports an increase in thirst and polyuria.  PLAN: -Repeat Hgb A1c today is 5.7%, will notify patient and encourage diet/exercise

## 2023-02-18 NOTE — Progress Notes (Signed)
Established Patient Office Visit  Subjective   Patient ID: Laurie Hutchinson, female    DOB: [birth year]  Age: 42 y.o. MRN: [id number]  Chief Complaint  Patient presents with   Medication Refill    subxoxone    Laurie Hutchinson is a 42 year old female who presents today for Suboxone refill. Please see problem based assessment and plan for additional details.      Objective:     BP 134/84 (BP Location: Right Arm, Patient Position: Sitting, Cuff Size: Normal)   Pulse 70   Temp 97.8 F (36.6 C) (Oral)   Ht 5\' 3"  (1.6 m)   Wt 230 lb 4.8 oz (104.5 kg)   LMP 10/04/2016 (Exact Date)   SpO2 97%   BMI 40.80 kg/m  BP Readings from Last 3 Encounters:  02/18/23 134/84  01/20/23 (!) 155/96  08/03/20 (!) 146/96      Physical Exam Constitutional:      General: She is not in acute distress.    Appearance: She is obese. She is not ill-appearing.  Cardiovascular:     Rate and Rhythm: Normal rate and regular rhythm.     Heart sounds: No murmur heard. Pulmonary:     Effort: Pulmonary effort is normal.     Breath sounds: Normal breath sounds.  Skin:    General: Skin is warm and dry.  Neurological:     Mental Status: She is alert and oriented to person, place, and time.  Psychiatric:        Mood and Affect: Mood normal.      Results for orders placed or performed in visit on 02/18/23  Glucose, capillary  Result Value Ref Range   Glucose-Capillary 102 (H) 70 - 99 mg/dL  POC Hbg V7Q  Result Value Ref Range   Hemoglobin A1C 5.7 (A) 4.0 - 5.6 %   HbA1c POC (<> result, manual entry)     HbA1c, POC (prediabetic range)     HbA1c, POC (controlled diabetic range)      Last CBC Lab Results  Component Value Date   WBC 9.2 03/18/2017   HGB 13.6 03/18/2017   HCT 40.9 03/18/2017   MCV 93.2 03/18/2017   MCH 31.0 03/18/2017   RDW 13.4 03/18/2017   PLT 251 03/18/2017   Last metabolic panel Lab Results  Component Value Date   GLUCOSE 90 03/18/2017   NA 136  03/18/2017   K 3.5 03/18/2017   CL 104 03/18/2017   CO2 26 03/18/2017   BUN 11 03/18/2017   CREATININE 0.60 03/18/2017   GFRNONAA >60 03/18/2017   CALCIUM 9.1 03/18/2017   ANIONGAP 6 03/18/2017   Last hemoglobin A1c Lab Results  Component Value Date   HGBA1C 5.7 (A) 02/18/2023   Last thyroid functions Lab Results  Component Value Date   TSH 11.22 (H) 03/19/2019      The ASCVD Risk score (Arnett DK, et al., 2019) failed to calculate for the following reasons:   Cannot find a previous HDL lab   Cannot find a previous total cholesterol lab    Assessment & Plan:   Problem List Items Addressed This Visit       Cardiovascular and Mediastinum   Hypertension    Patient denies history of hypertension or requiring treatment for hypertension. During the last appointment, her bp was 155/96 and today the blood pressure was 146/75. Due to history of prediabetes, will begin ARB. PLAN: -Begin losartan 25mg  after BMP results today -Repeat BMP in one  month  -Patient instructed to check BP weekly and to bring cuff in to next appointment to check for accuracy       Relevant Orders   BMP8+Anion Gap     Digestive   GERD (gastroesophageal reflux disease)    She presents with a history of heartburn that has been well controlled on protonix 40mg  daily. She reports heartburn symptoms if she misses a dose. PLAN: -Refill Protonix 40 mg        Relevant Medications   pantoprazole (PROTONIX) 40 MG tablet     Endocrine   Hypothyroidism    Patient presents with history of hypothyroidism currently on of synthroid. Today, she reports night sweats and increase in hot intolerance that has correlated with the recent increase in synthroid to .  The last TSH in June was 0.731.  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 Today, she reports good compliance with the suboxone. She denied relapses, cravings, withdrawal symptoms, or side effects of medications.  PLAN: -toxassure ordered today -will refill suboxone, continue current dose -return in 30 days       Relevant Orders   ToxAssure Select,+Antidepr,UR   Prediabetes - Primary    She presents with a history of prediabetes. Her last A1c check was in 2023 and measured at 5.8%. She reports an increase in thirst and polyuria.  PLAN: -Repeat Hgb A1c today is 5.7%, will notify patient and encourage diet/exercise      Relevant Orders   POC Hbg A1C (Completed)   Other Visit Diagnoses     Night sweats       Relevant Orders   HIV antibody (with reflex)   Hepatitis C Ab reflex to Quant PCR        Return in about 4 weeks (around 03/18/2023) for suboxone follow up .    Faith Rogue, DO

## 2023-02-18 NOTE — Assessment & Plan Note (Signed)
Patient presents with history of hypothyroidism currently on of synthroid. Today, she reports night sweats and increase in hot intolerance that has correlated with the recent increase in synthroid to .  The last TSH in June was 0.731.  Plan: -Decrease dose to -repeat TSH in 4-6 weeks  -due to other concerns of night sweats, will order Hep c and HIV screening next appointment

## 2023-02-18 NOTE — Assessment & Plan Note (Signed)
>>  ASSESSMENT AND PLAN FOR GERD (GASTROESOPHAGEAL REFLUX DISEASE) WRITTEN ON 02/18/2023  6:16 PM BY BENDER, EMILY, DO  She presents with a history of heartburn that has been well controlled on protonix  40mg  daily. She reports heartburn symptoms if she misses a dose. PLAN: -Refill Protonix  40 mg

## 2023-02-18 NOTE — Assessment & Plan Note (Signed)
Patient denies history of hypertension or requiring treatment for hypertension. During the last appointment, her bp was 155/96 and today the blood pressure was 146/75. Due to history of prediabetes, will begin ARB. PLAN: -Begin losartan 25mg  after BMP results today -Repeat BMP in one month  -Patient instructed to check BP weekly and to bring cuff in to next appointment to check for accuracy

## 2023-02-18 NOTE — Assessment & Plan Note (Signed)
Patient was recently started on suboxone 8mg /2mg  BID 30 days ago. Today, she reports good compliance with the suboxone. She denied relapses, cravings, withdrawal symptoms, or side effects of medications.  PLAN: -toxassure ordered today -will refill suboxone, continue current dose -return in 30 days

## 2023-02-19 ENCOUNTER — Other Ambulatory Visit: Payer: Self-pay | Admitting: Student

## 2023-02-19 MED ORDER — BUPRENORPHINE HCL-NALOXONE HCL 8-2 MG SL FILM: 1.0000 | ORAL_FILM | Freq: Two times a day (BID) | SUBLINGUAL | 0 refills | Status: DC

## 2023-02-19 NOTE — Progress Notes (Signed)
Attempted to call patient. Cr is mildly elevated, will recheck in 4 weeks after starting losartan. Will attempt to call again later. A1c is elevated at 5.7, continue to diet/exercise.

## 2023-02-19 NOTE — Progress Notes (Signed)
Checked PDMP prior to filling suboxone. Will be filled 30 days after prior fill.

## 2023-02-20 NOTE — Progress Notes (Signed)
Spoke to patient on the phone regarding lab results.  She was notified that her serum creatinine is elevated at 1.05, will repeat BMP in 4 weeks with starting the losartan.  She is aware that her A1c is elevated at 5.7 and glucose capillary elevated at 102, she has a history of prediabetes we will continue to monitor.  She was instructed to diet and exercise.  We discussed the results of the tox assure.  She did test positive for buprenorphine and norbuprenorphine which was as expected.  She did test positive for amphetamines, she is not prescribed any controlled amphetamines.  When I notified the patient of her results, she thinks that it is due to over-the-counter and online supplements that she has been taking to burn fat and boost energy.  She was instructed to discontinue the supplements.  Patient was reminded of the controlled medication treatment agreement contract signed when she began Suboxone.  She denied any use of any other drugs such as methamphetamine or Adderall.  Will repeat tox assure at next appointment in 4 weeks.   Supplements "caffeine" root to help energy/burn fat innoshred + tumeric beat root, carb cut complete, caffeine + thiamine.

## 2023-02-20 NOTE — Progress Notes (Signed)
Attempted to call patient again this afternoon. Will try again.

## 2023-02-23 NOTE — Progress Notes (Signed)
 Internal Medicine Clinic Attending  I was physically present during the key portions of the resident provided service and participated in the medical decision making of patient's management care. I reviewed pertinent patient test results.  The assessment, diagnosis, and plan were formulated together and I agree with the documentation in the resident's note.  Inez Catalina, MD

## 2023-02-25 ENCOUNTER — Ambulatory Visit: Payer: 59

## 2023-04-15 ENCOUNTER — Ambulatory Visit: Payer: 59 | Admitting: Student

## 2023-04-15 ENCOUNTER — Other Ambulatory Visit: Payer: Self-pay

## 2023-04-15 ENCOUNTER — Encounter: Payer: Self-pay | Admitting: Student

## 2023-04-15 VITALS — BP 139/98 | HR 77 | Temp 98.3°F | Ht 63.0 in | Wt 234.2 lb

## 2023-04-15 DIAGNOSIS — E039 Hypothyroidism, unspecified: Secondary | ICD-10-CM | POA: Diagnosis not present

## 2023-04-15 DIAGNOSIS — I10 Essential (primary) hypertension: Secondary | ICD-10-CM

## 2023-04-15 DIAGNOSIS — M5412 Radiculopathy, cervical region: Secondary | ICD-10-CM

## 2023-04-15 DIAGNOSIS — F1121 Opioid dependence, in remission: Secondary | ICD-10-CM

## 2023-04-15 DIAGNOSIS — Z789 Other specified health status: Secondary | ICD-10-CM | POA: Insufficient documentation

## 2023-04-15 MED ORDER — LOSARTAN POTASSIUM 25 MG PO TABS: 25.0000 mg | ORAL_TABLET | Freq: Every day | ORAL | 2 refills | Status: DC

## 2023-04-15 MED ORDER — BUPRENORPHINE HCL-NALOXONE HCL 8-2 MG SL FILM: 1.0000 | ORAL_FILM | Freq: Two times a day (BID) | SUBLINGUAL | 0 refills | Status: DC

## 2023-04-15 NOTE — Assessment & Plan Note (Addendum)
Previously on Synthroid 137 mg/day, her Synthroid was decreased to 125 mg per day but the patient stated that she began having issues with her energy, fatigue, and hair falling out.  She increased her dose back to 137 mg/day without issue.  Of note she has been taking ashwaganada which is an herbal supplement which can affect the thyroid.  This was discussed with the patient. Plan: TSH today, depending on results we will titrate levothyroxine.

## 2023-04-15 NOTE — Assessment & Plan Note (Signed)
Patient brings in a myriad of supplements today and requested our opinion regarding the supplements.  I discussed with the patient that these supplements are not FDA approved and is very difficult to provide accurate information as there is limited testing with these supplements.  Furthermore it is not unheard of for supplement manufacturers to lie about what is their supplements, with certain supplements even containing illicit substances such as amphetamines (as may have been the case with this patient with a positive UDS for amphetamines which she adamantly denies taking).  I discussed that some supplements such as vitamins are likely safe, however others which contain herbal products or extracts can interact with drugs and be potentially pharmacologically active themselves.  It is difficult to know how these medications interact with other medications or her body.  We discussed that we do not recommend continuing to use supplements such as fat burning supplements or herbal supplements.  She may continue to take vitamins which are relatively low risk (especially if water soluble) but we cannot vouch for the non-prescription supplements she is taking.

## 2023-04-15 NOTE — Assessment & Plan Note (Signed)
Patient presents today for follow-up on her OUD.  She is currently taking Suboxone 8-2 twice daily.  At last visit she had a UDS positive for amphetamines which she attributed to over-the-counter dietary supplements which she was taking.  She stopped taking these fat burning supplements, but is on a myriad of other supplements including another energy boosting supplement seems.  She denies illicit drug use.  Her last fill of Suboxone was on 02/20/2023.  I asked the patient if she ran out of medication, and she stated that she had ran out but did not have an appointment until today so she acquired some outside of this clinic, and was afraid of relapsing.  She states that she is taking Suboxone.  She also mentions to me that she is taking a supplement which contains cannabinoids.  I stated that this may show up on her drug test.  We discussed that moving forward is going to be important for [place] to be in better communication.  She cites a miscommunication regarding making an appointment and calling our office as the reason for this confusion.  We went ahead and reiterated the policy regarding being a Suboxone patient at our clinic.  She was not obtain medications from any other source.  We discussed that in the future if she has any issues she can always call us and request to speak with the physician he should return her call.  Plan: Repeat tox assure today Refilled Suboxone prescription Patient will follow-up with [place] in 3 to 4 weeks for next refill

## 2023-04-15 NOTE — Progress Notes (Signed)
Subjective:  CC: OUD, Left sided radicular pain  HPI:  [name] is a 42 y.o. person with a past medical history stated below and presents today for follow up and pain. Please see problem based assessment and plan for additional details.  Past Medical History:  Diagnosis Date   ADD (attention deficit disorder)    ADHD    Anemia    Cancer (HCC)    cervical cancer   Complication of anesthesia    unknown druf 2010 during surgery caused anaphylaxis. Had TAH at [facility] 2018 and were unable to obtain records form 2010 surgery. Did fine with 2018 TAH.   Depression    Deviated nasal septum    GERD (gastroesophageal reflux disease)    Heart murmur    had leaky mitral valve as child and also had rheumatic fever.   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   Panic attack    Pneumonia    Rheumatic fever     Current Outpatient Medications on File Prior to Visit  Medication Sig Dispense Refill   levothyroxine (SYNTHROID) 125 MCG tablet Take 1 tablet (125 mcg total) by mouth daily. 30 tablet 11   pantoprazole (PROTONIX) 40 MG tablet Take 1 tablet (40 mg total) by mouth daily. 30 tablet 1   phenazopyridine (PYRIDIUM) 100 MG tablet Take 1 tablet (100 mg total) by mouth 3 (three) times daily as needed for pain. 10 tablet 0   [DISCONTINUED] estradiol (ESTRACE) 1 MG tablet Take 1 tablet (1 mg total) by mouth 2 (two) times daily. 60 tablet 1   No current facility-administered medications on file prior to visit.    Review of Systems: Please see assessment and plan for pertinent positives and negatives.  Objective:   Vitals:   04/15/23 0936 04/15/23 1033  BP: (!) 146/88 (!) 139/98  Pulse: 83 77  Temp: 98.3 F (36.8 C)   TempSrc: Oral   SpO2: 98%   Weight: 234 lb 3.2 oz (106.2 kg)   Height: 5\' 3"  (1.6 m)      Physical Exam: Constitutional: Well-appearing, in no acute distress Cardiovascular: regular rate and rhythm, no m/r/g Pulmonary/Chest: normal work of breathing on room air, lungs clear to auscultation bilaterally Abdominal: soft, non-tender, non-distended Extremities: No edema of the lower extremities bilaterally Neurological: Symmetric at the elbows, hips, and shoulders bilaterally.  Symmetric patellar and biceps reflexes bilaterally.  Decreased sensation of the fingers of the left hand.  Positive Spurling test for left-sided radiculopathy Skin: warm and dry Psych: Normal mood and affect     Assessment & Plan:  Hypertension Patient hypertensive today to 139/98.  She says that she has been out of her blood pressure medication.  We discussed with the patient that she can always call into the office or pharmacy in order to send in a refill request for her medications if she needs to. Plan: Refilled Cozaar 25 mg BMP today   Hypothyroidism Previously on Synthroid 137 mg/day, her Synthroid was decreased to 125 mg per day but the patient stated that she began having issues with her energy, fatigue, and hair falling out.  She increased her dose back to 137 mg/day without issue.  Of note she has been taking ashwaganada which is an herbal supplement which can affect the thyroid.  This was discussed with the patient. Plan: TSH today, depending on results we will titrate levothyroxine.   Cervical radiculopathy Patient describes pain and weakness in the left arm.  It does not follow a classic nerve distribution.  She says she has numbness in all her fingertips as well as weakness of her hand.  When lifting objects she has pain in the medial side of the elbow which shoots down into the arm and all her fingers.  When lifting her arm above her head she has pain down the lateral side of the elbow into the forearm, hand and fingers.  She describes the pain the same sensation you get when you hit your  funny bone.  She denies neck pain, but does endorse some neck shoulder and upper back pain when lifting the arm above her head.  She wakes up in the morning with this pain and has to shake her arm out.  She says lifting objects makes the pain worse, has not found anything to make the pain better.  Positive Spurling test for [place] today.  Her exam and constellation of symptoms is most compatible with cervical radiculopathy. 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  She is currently taking Suboxone 8-2 twice daily.  At last visit she had a UDS positive for amphetamines which she attributed to over-the-counter dietary supplements which she was taking.  She stopped taking these fat burning supplements, but is on a myriad of other supplements including another energy boosting supplement seems.  She denies illicit drug use.  Her last fill of Suboxone was on 02/20/2023.  I asked the patient if she ran out of medication, and she stated that she had ran out but did not have an appointment until today so she acquired some outside of this clinic, and was afraid of relapsing.  She states that she is taking Suboxone.  She also mentions to me that she is taking a supplement which contains cannabinoids.  I stated that this may show up on her drug test.  We discussed that moving forward is going to be important for [place] to be in better communication.  She cites a miscommunication regarding making an appointment and calling our office as the reason for this confusion.  We went ahead and reiterated the policy regarding being a Suboxone patient at our clinic.  She was not obtain medications from any other source.  We discussed that in the future if she has any issues she can always call us and request to speak with the physician he should return her call.  Plan: Repeat tox assure today Refilled Suboxone  prescription Patient will follow-up with [place] in 3 to 4 weeks for next refill   Takes dietary supplements Patient brings in a myriad of supplements today and requested our opinion regarding the supplements.  I discussed with the patient that these supplements are not FDA approved and is very difficult to provide accurate information as there is limited testing with these supplements.  Furthermore it is not unheard of for supplement manufacturers to lie about what is their supplements, with certain supplements even containing illicit substances such as amphetamines (as may have been the case with this patient with a positive UDS for amphetamines which she adamantly denies taking).  I discussed that some supplements such as vitamins are likely safe, however others which contain herbal products or extracts can interact with  drugs and be potentially pharmacologically active themselves.  It is difficult to know how these medications interact with other medications or her body.  We discussed that we do not recommend continuing to use supplements such as fat burning supplements or herbal supplements.  She may continue to take vitamins which are relatively low risk (especially if water soluble) but we cannot vouch for the non-prescription supplements she is taking.   Patient seen with Dr. Elliot Cousin MD [facility] Internal Medicine  PGY-1 Pager: [phone]  Phone: [phone] Date 04/15/2023  Time 1:39 PM

## 2023-04-15 NOTE — Patient Instructions (Addendum)
Thank you, Ms.Cletis Athens for allowing [place] to provide your care today.  I have ordered the following tests for you: Lab Orders         TSH         BMP8+Anion Gap         ToxAssure Select,+Antidepr,UR       Referrals ordered today:  Referral Orders         Ambulatory referral to Physical Therapy      I have ordered the following medication/changed the following medications:   Stop the following medications: Medications Discontinued During This Encounter  Medication Reason   Buprenorphine HCl-Naloxone HCl (SUBOXONE) 8-2 MG FILM Reorder   losartan (COZAAR) 25 MG tablet Reorder     Start the following medications: Meds ordered this encounter  Medications   Buprenorphine HCl-Naloxone HCl (SUBOXONE) 8-2 MG FILM    Sig: Place 1 Film under the tongue in the morning and at bedtime.    Dispense:  60 each    Refill:  0   losartan (COZAAR) 25 MG tablet    Sig: Take 1 tablet (25 mg total) by mouth daily.    Dispense:  30 tablet    Refill:  2       Follow up:  3-4 weeks  for Suboxone refill   Please remember: If you have issues in the future regarding an appointment or with your medication running out please call and ask. You can always request to speak with the on call physician if you have concerns.   We look forward to seeing you next time. Please call our clinic at [phone] if you have any questions or concerns. The best time to call is Monday-Friday from 9am-4pm, but there is someone available 24/7. If after hours or the weekend, call the main hospital number and ask for the Internal Medicine Resident On-Call. If you need medication refills, please notify your pharmacy one week in advance and they will send [place] a request.   Thank you for trusting me with your care. Wishing you the best!  Lovie Macadamia MD [facility]

## 2023-04-15 NOTE — Assessment & Plan Note (Addendum)
Patient hypertensive today to 139/98.  She says that she has been out of her blood pressure medication.  We discussed with the patient that she can always call into the office or pharmacy in order to send in a refill request for her medications if she needs to. Plan: Refilled Cozaar 25 mg BMP today

## 2023-04-15 NOTE — Assessment & Plan Note (Addendum)
Patient describes pain and weakness in the left arm.  It does not follow a classic nerve distribution.  She says she has numbness in all her fingertips as well as weakness of her hand.  When lifting objects she has pain in the medial side of the elbow which shoots down into the arm and all her fingers.  When lifting her arm above her head she has pain down the lateral side of the elbow into the forearm, hand and fingers.  She describes the pain the same sensation you get when you hit your funny bone.  She denies neck pain, but does endorse some neck shoulder and upper back pain when lifting the arm above her head.  She wakes up in the morning with this pain and has to shake her arm out.  She says lifting objects makes the pain worse, has not found anything to make the pain better.  Positive Spurling test for [place] today.  Her exam and constellation of symptoms is most compatible with cervical radiculopathy. Plan: Referral to physical therapy

## 2023-04-16 ENCOUNTER — Telehealth: Payer: Self-pay | Admitting: Student

## 2023-04-16 LAB — BMP8+ANION GAP
Anion Gap: 12 mmol/L (ref 10.0–18.0)
BUN/Creatinine Ratio: 22 (ref 9–23)
BUN: 16 mg/dL (ref 6–24)
CO2: 25 mmol/L (ref 20–29)
Calcium: 9.9 mg/dL (ref 8.7–10.2)
Chloride: 101 mmol/L (ref 96–106)
Creatinine, Ser: 0.73 mg/dL (ref 0.57–1.00)
Glucose: 100 mg/dL — ABNORMAL HIGH (ref 70–99)
Potassium: 5.1 mmol/L (ref 3.5–5.2)
Sodium: 138 mmol/L (ref 134–144)
eGFR: 105 mL/min/{1.73_m2} (ref >59)

## 2023-04-16 LAB — TSH: TSH: 7.12 u[IU]/mL — ABNORMAL HIGH (ref 0.450–4.500)

## 2023-04-16 MED ORDER — LEVOTHYROXINE SODIUM 137 MCG PO TABS: 137.0000 ug | ORAL_TABLET | Freq: Every day | ORAL | 0 refills | Status: DC

## 2023-04-16 NOTE — Telephone Encounter (Signed)
I spoke with Laurie Hutchinson on the phone. Patient's identity was confirmed using two patient specific identifiers. We discussed her recent lab work.  Kidney function normalized which I am happy about.  TSH elevated today, however difficult to interpret as she went from the prescribed 125 dose to the 137 dose.  I will keep her on the 137 dose, we will reassess her TSH at next visit on the 29th.  Tox assure still pending and I will call her with the results of that.  Plan: Levothyroxine 137, repeat TSH next visit.

## 2023-04-17 NOTE — Progress Notes (Signed)
Internal Medicine Clinic Attending  I was physically present during the key portions of the resident provided service and participated in the medical decision making of patient's management care. I reviewed pertinent patient test results.  The assessment, diagnosis, and plan were formulated together and I agree with the documentation in the resident's note.  Mercie Eon, MD

## 2023-04-18 LAB — TOXASSURE SELECT,+ANTIDEPR,UR

## 2023-04-23 ENCOUNTER — Telehealth: Payer: Self-pay | Admitting: *Deleted

## 2023-04-23 NOTE — Telephone Encounter (Signed)
Spoke with patient states is going to call and schedule her mammogram. Breast center number was given to patient to call @  [phone].

## 2023-05-13 ENCOUNTER — Ambulatory Visit: Payer: 59 | Admitting: Student

## 2023-05-13 ENCOUNTER — Other Ambulatory Visit: Payer: Self-pay

## 2023-05-13 ENCOUNTER — Encounter: Payer: Self-pay | Admitting: Student

## 2023-05-13 VITALS — BP 140/71 | HR 86 | Temp 98.1°F | Ht 63.0 in | Wt 235.7 lb

## 2023-05-13 DIAGNOSIS — M5412 Radiculopathy, cervical region: Secondary | ICD-10-CM | POA: Diagnosis not present

## 2023-05-13 DIAGNOSIS — E039 Hypothyroidism, unspecified: Secondary | ICD-10-CM | POA: Diagnosis not present

## 2023-05-13 DIAGNOSIS — F1191 Opioid use, unspecified, in remission: Secondary | ICD-10-CM

## 2023-05-13 DIAGNOSIS — F1121 Opioid dependence, in remission: Secondary | ICD-10-CM

## 2023-05-13 DIAGNOSIS — I10 Essential (primary) hypertension: Secondary | ICD-10-CM | POA: Diagnosis not present

## 2023-05-13 MED ORDER — BUPRENORPHINE HCL-NALOXONE HCL 8-2 MG SL FILM: 1.0000 | ORAL_FILM | Freq: Two times a day (BID) | SUBLINGUAL | 0 refills | Status: DC

## 2023-05-13 MED ORDER — LOSARTAN POTASSIUM 50 MG PO TABS: 50.0000 mg | ORAL_TABLET | Freq: Every day | ORAL | 3 refills | Status: AC

## 2023-05-13 NOTE — Patient Instructions (Addendum)
Thank you so much for coming to the clinic today! Today we talked about:  High blood pressure: Today your blood pressure was 140/71, which is a little higher than goal of 130. We are increasing your losartan to 50mg  instead of 25mg . We want to see you back in two weeks for a blood pressure check and some labs, and then in about a month for follow-up regarding your Suboxone.  We are also checking your TSH today, I will give you a call soon as you can when the results are back.    If you have any questions please feel free to the call the clinic at anytime at [phone]. It was a pleasure seeing you!  Best, Dr. Thomasene Ripple

## 2023-05-13 NOTE — Progress Notes (Incomplete)
This is a Psychologist, occupational Note.  The care of the patient was discussed with Dr. Lafonda Mosses and the assessment and plan was formulated with their assistance.  Please see their note for official documentation of the patient encounter.   Subjective:   Patient ID: Laurie Hutchinson female   DOB: 03-27-1981 42 y.o.   MRN: [id number]  HPI: Laurie Hutchinson is a 42 y.o. female presenting for medication refill and pain and TSH check.  Past Medical History:  Diagnosis Date   ADD (attention deficit disorder)    ADHD    Anemia    Cancer (HCC)    cervical cancer   Complication of anesthesia    unknown druf 2010 during surgery caused anaphylaxis. Had TAH at [facility] 2018 and were unable to obtain records form 2010 surgery. Did fine with 2018 TAH.   Depression    Deviated nasal septum    GERD (gastroesophageal reflux disease)    Heart murmur    had leaky mitral valve as child and also had rheumatic fever.   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   Panic attack    Pneumonia    Rheumatic fever    Current Outpatient Medications  Medication Sig Dispense Refill   Buprenorphine HCl-Naloxone HCl (SUBOXONE) 8-2 MG FILM Place 1 Film under the tongue in the morning and at bedtime. 60 each 0   levothyroxine (SYNTHROID) 137 MCG tablet Take 1 tablet (137 mcg total) by mouth daily before breakfast. 30 tablet 0   losartan (COZAAR) 25 MG tablet Take 1 tablet (25 mg total) by mouth daily. 30 tablet 2   pantoprazole (PROTONIX) 40 MG tablet Take 1 tablet (40 mg total) by mouth daily. 30 tablet 1   phenazopyridine (PYRIDIUM) 100 MG tablet Take 1 tablet (100 mg total) by mouth 3 (three) times daily as needed for pain. 10 tablet 0   [DISCONTINUED] levothyroxine (SYNTHROID) 125 MCG tablet Take 1 tablet (125 mcg total) by mouth daily. (Patient taking  differently: Take 137 mcg by mouth daily.) 30 tablet 11   No current facility-administered medications for this visit.   Family History  Problem Relation Age of Onset   Hypertension Mother    Thyroid disease Mother    Heart disease Father    Heart attack Maternal Grandmother    Heart attack Maternal Grandfather    Heart attack Paternal Grandmother    Heart attack Paternal Grandfather    Alcohol abuse Brother    ADD / ADHD Daughter    Strabismus Daughter    Migraines Daughter    Alcohol abuse Brother    Social History   Socioeconomic History   Marital status: Single    Spouse name: Not on file   Number of children: 3   Years of education: Associates   Highest education level: Not on file  Occupational History   Occupation: Investment banker, corporate  Tobacco Use   Smoking status: Light Smoker    Current packs/day: 0.50    Average packs/day: 0.5 packs/day for 20.0 years (10.0 ttl pk-yrs)    Types: Cigarettes, E-cigarettes   Smokeless tobacco: Never   Tobacco comments:    Vapes  1-2 cigs per month.  Vaping Use   Vaping status: Every Day   Substances: Nicotine  Substance and Sexual Activity  Alcohol use: Yes    Comment: social   Drug use: No   Sexual activity: Not Currently    Birth control/protection: None  Other Topics Concern   Not on file  Social History Narrative   Lives at home with daughter.   Right-handed.   2-3 cups caffeine per day.   Was engaged but lost fiance in car wreck in early 2020s.   Social Determinants of Health   Financial Resource Strain: Not on file  Food Insecurity: Not on file  Transportation Needs: Not on file  Physical Activity: Not on file  Stress: Not on file  Social Connections: Unknown (07/18/2022)   Received from [facility]   Social Network    Social Network: Not on file   Review of Systems: Pertinent items noted in HPI and remainder of comprehensive ROS otherwise negative.  Objective:  Physical Exam: Vitals:    05/13/23 0906  BP: (!) 140/71  Pulse: 86  Temp: 98.1 F (36.7 C)  TempSrc: Oral  Weight: 235 lb 11.2 oz (106.9 kg)  Height: 5\' 3"  (1.6 m)   BP (!) 140/71 (BP Location: Left Arm, Patient Position: Sitting, Cuff Size: Large)   Pulse 86   Temp 98.1 F (36.7 C) (Oral)   Ht 5\' 3"  (1.6 m)   Wt 235 lb 11.2 oz (106.9 kg)   LMP 10/04/2016 (Exact Date)   BMI 41.75 kg/m   Assessment & Plan:  Hypertension Patient is hypertensive today to 140/71, which is above goal. She says that when she takes her blood pressure at home it is in the 130-150 range and has been like that for many years. She takes her losartan 25 mg at night because she takes synthroid in the morning and is worried about the medication side effects during the day. Of note, her potassium last visit was on the higher end of normal at 5.1. - Increase losartan to 50 mg daily  - Nurse-only visit in 2 weeks for BP check and BMP  Hypothyroidism Currently on Synthroid 137 mg daily, which she states is her preferred dose. At a lower 125 mg dose, she was experiencing hair loss, decreased energy, and fatigue, which she is no longer experiencing. She has been returned to her higher dose since last visit (04/15/23). - Continue Synthroid 137 mg daily - TSH today  Cervical reticulopathy Previously experienced pain and weakness of the left arm and pain in the shoulder and upper back when lifting her harm above her head. She has received some advice and exercises from a friend who does PT, which combined with sessions with a massage therapist has resolved her pain.  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 She takes Suboxone 8-2 BID. Fill history is accurate. She does not report cravings or concerns about relapse.  - Repeat ToxAssure today - Refilled Suboxone - Follow-up for next refill in 3-4 weeks  Dietary supplements After last  visit, patient has discontinued all supplements aside from an energy booster and biotin. She is now wary of supplement claims and is in fact now warning her friends away from supplements herself.   Thea Alken, MS3

## 2023-05-14 LAB — TSH: TSH: 3.99 u[IU]/mL (ref 0.450–4.500)

## 2023-05-14 NOTE — Assessment & Plan Note (Signed)
Previously experienced pain and weakness of the left arm and pain in the shoulder and upper back when lifting her harm above her head. She has received some advice and exercises from a friend who does PT, which combined with sessions with a massage therapist has resolved her pain.  - Continue PT exercises

## 2023-05-14 NOTE — Progress Notes (Signed)
CC: TSH/HTN follow up  HPI:  [name] is a 42 y.o. female living with a history stated below and presents today for blood pressure check. Please see problem based assessment and plan for additional details.  Past Medical History:  Diagnosis Date   ADD (attention deficit disorder)    ADHD    Anemia    Cancer (HCC)    cervical cancer   Complication of anesthesia    unknown druf 2010 during surgery caused anaphylaxis. Had TAH at [facility] 2018 and were unable to obtain records form 2010 surgery. Did fine with 2018 TAH.   Depression    Deviated nasal septum    GERD (gastroesophageal reflux disease)    Heart murmur    had leaky mitral valve as child and also had rheumatic fever.   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   Panic attack    Pneumonia    Rheumatic fever     Current Outpatient Medications on File Prior to Visit  Medication Sig Dispense Refill   levothyroxine (SYNTHROID) 137 MCG tablet Take 1 tablet (137 mcg total) by mouth daily before breakfast. 30 tablet 0   pantoprazole (PROTONIX) 40 MG tablet Take 1 tablet (40 mg total) by mouth daily. 30 tablet 1   phenazopyridine (PYRIDIUM) 100 MG tablet Take 1 tablet (100 mg total) by mouth 3 (three) times daily as needed for pain. 10 tablet 0   [DISCONTINUED] estradiol (ESTRACE) 1 MG tablet Take 1 tablet (1 mg total) by mouth 2 (two) times daily. 60 tablet 1   [DISCONTINUED] levothyroxine (SYNTHROID) 125 MCG tablet Take 1 tablet (125 mcg total) by mouth daily. (Patient taking differently: Take 137 mcg by mouth daily.) 30 tablet 11   No current facility-administered medications on file prior to visit.    Family History  Problem Relation Age of Onset   Hypertension Mother    Thyroid disease Mother    Heart disease Father    Heart attack Maternal Grandmother     Heart attack Maternal Grandfather    Heart attack Paternal Grandmother    Heart attack Paternal Grandfather    Alcohol abuse Brother    ADD / ADHD Daughter    Strabismus Daughter    Migraines Daughter    Alcohol abuse Brother     Social History   Socioeconomic History   Marital status: Single    Spouse name: Not on file   Number of children: 3   Years of education: Associates   Highest education level: Not on file  Occupational History   Occupation: Investment banker, corporate  Tobacco Use   Smoking status: Light Smoker    Current packs/day: 0.50    Average packs/day: 0.5 packs/day for 20.0 years (10.0 ttl pk-yrs)    Types: Cigarettes, E-cigarettes   Smokeless tobacco: Never   Tobacco comments:    Vapes  1-2 cigs per month.  Vaping Use   Vaping status: Every Day   Substances: Nicotine  Substance and Sexual Activity   Alcohol use: Yes    Comment: social   Drug use: No   Sexual activity: Not Currently    Birth control/protection: None  Other Topics Concern   Not on file  Social History Narrative   Lives at home with daughter.   Right-handed.   2-3  cups caffeine per day.   Was engaged but lost fiance in car wreck in early 2020s.   Social Determinants of Health   Financial Resource Strain: Not on file  Food Insecurity: Not on file  Transportation Needs: Not on file  Physical Activity: Not on file  Stress: Not on file  Social Connections: Unknown (07/18/2022)   Received from [facility]   Social Network    Social Network: Not on file  Intimate Partner Violence: Unknown (07/18/2022)   Received from [facility], [facility]   HITS    Physically Hurt: Not on file    Insult or Talk Down To: Not on file    Threaten Physical Harm: Not on file    Scream or Curse: Not on file    Review of Systems: ROS negative except for what is noted on the assessment and plan.  Vitals:   05/13/23 0906  BP: (!) 140/71  Pulse: 86  Temp: 98.1 F (36.7 C)  TempSrc:  Oral  Weight: 235 lb 11.2 oz (106.9 kg)  Height: 5\' 3"  (1.6 m)    Physical Exam: Constitutional: well-appearing female  in no acute distress Cardiovascular: regular rate and rhythm, no m/r/g Pulmonary/Chest: normal work of breathing on room air, lungs clear to auscultation bilaterally Abdominal: soft, non-tender, non-distended  Assessment & Plan:   Hypertension Patient is hypertensive today to 140/71, which is above goal. She says that when she takes her blood pressure at home it is in the 130-150 range and has been like that for many years. She takes her losartan 25 mg at night because she takes synthroid in the morning and is worried about the medication side effects during the day. Of note, her potassium last visit was on the higher end of normal at 5.1. - Increase losartan to 50 mg daily  - Nurse-only visit in 2 weeks for BP check and BMP  Hypothyroidism Currently on Synthroid 137 mg daily, which she states is her preferred dose. At a lower 125 mg dose, she was experiencing hair loss, decreased energy, and fatigue, which she is no longer experiencing. She has been returned to her higher dose since last visit (04/15/23). - Continue Synthroid 137 mg daily - TSH today  Cervical radiculopathy Previously experienced pain and weakness of the left arm and pain in the shoulder and upper back when lifting her harm above her head. She has received some advice and exercises from a friend who does PT, which combined with sessions with a massage therapist has resolved her pain.  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 She takes Suboxone 8-2 BID. Fill history is accurate. She does not report cravings or concerns about relapse.  - Repeat ToxAssure today - Refilled Suboxone - Follow-up for next refill in 3-4 weeks  Patient discussed with Dr. Gwendlyn Deutscher, M.D. [facility]  Health Internal Medicine, PGY-2 Pager: [phone] Date 05/14/2023 Time 7:42 AM

## 2023-05-14 NOTE — Progress Notes (Signed)
Internal Medicine Clinic Attending  Case discussed with the resident at the time of the visit.  We reviewed the resident's history and exam and pertinent patient test results.  I agree with the assessment, diagnosis, and plan of care documented in the resident's note.    Agree with plan to increase Losartan TSH is normal now - continue current dose of levothyroxine

## 2023-05-14 NOTE — Addendum Note (Signed)
Addended by: Derrek Monaco on: 05/14/2023 10:13 AM   Modules accepted: Level of Service

## 2023-05-14 NOTE — Assessment & Plan Note (Signed)
Patient presenting today for refill of her Suboxone. She takes Suboxone 8-2 BID. Fill history is accurate. She does not report cravings or concerns about relapse.  - Repeat ToxAssure today - Refilled Suboxone - Follow-up for next refill in 3-4 weeks

## 2023-05-14 NOTE — Assessment & Plan Note (Signed)
Patient is hypertensive today to 140/71, which is above goal. She says that when she takes her blood pressure at home it is in the 130-150 range and has been like that for many years. She takes her losartan 25 mg at night because she takes synthroid in the morning and is worried about the medication side effects during the day. Of note, her potassium last visit was on the higher end of normal at 5.1. - Increase losartan to 50 mg daily  - Nurse-only visit in 2 weeks for BP check and BMP

## 2023-05-14 NOTE — Assessment & Plan Note (Signed)
Currently on Synthroid 137 mg daily, which she states is her preferred dose. At a lower 125 mg dose, she was experiencing hair loss, decreased energy, and fatigue, which she is no longer experiencing. She has been returned to her higher dose since last visit (04/15/23). - Continue Synthroid 137 mg daily - TSH today

## 2023-05-20 ENCOUNTER — Telehealth: Payer: Self-pay | Admitting: *Deleted

## 2023-05-20 NOTE — Telephone Encounter (Signed)
Called patient lvm regarding her mammogram 06-11-2023 @ 12:50 pm arrive 12:40 pm/ aware of the $75 no show fee charge.

## 2023-05-23 ENCOUNTER — Other Ambulatory Visit: Payer: Self-pay | Admitting: Student

## 2023-05-23 NOTE — Telephone Encounter (Signed)
Buprenorphine HCl-Naloxone HCl (SUBOXONE) 8-2 MG FILM   [id number] - [place], [place] - [address]

## 2023-05-27 ENCOUNTER — Other Ambulatory Visit: Payer: Self-pay | Admitting: *Deleted

## 2023-05-27 ENCOUNTER — Ambulatory Visit: Payer: 59 | Admitting: *Deleted

## 2023-05-27 DIAGNOSIS — I10 Essential (primary) hypertension: Secondary | ICD-10-CM | POA: Diagnosis not present

## 2023-05-27 MED ORDER — LEVOTHYROXINE SODIUM 137 MCG PO TABS: 137.0000 ug | ORAL_TABLET | Freq: Every day | ORAL | 0 refills | Status: DC

## 2023-05-27 NOTE — Progress Notes (Signed)
    Laurie Hutchinson presented today for blood pressure check. Patient is prescribed blood pressure medications and I confirmed that patient did take their blood pressure medication prior to today's appointment. Blood pressure was taken in the usual and appropriate manner using an automated BP cuff.     Vitals:   05/27/23 1020 05/27/23 1027  BP: (!) 144/85 119/76      Results of today's visit will be routed to Dr. Thomasene Ripple for review and further management.

## 2023-05-27 NOTE — Telephone Encounter (Signed)
Next appt scheduled 12/3 with Dr Ninfa Meeker.

## 2023-05-28 ENCOUNTER — Other Ambulatory Visit: Payer: Self-pay | Admitting: Student

## 2023-05-28 DIAGNOSIS — K219 Gastro-esophageal reflux disease without esophagitis: Secondary | ICD-10-CM

## 2023-05-28 LAB — BMP8+ANION GAP
Anion Gap: 11 mmol/L (ref 10.0–18.0)
BUN/Creatinine Ratio: 23 (ref 9–23)
BUN: 14 mg/dL (ref 6–24)
CO2: 25 mmol/L (ref 20–29)
Calcium: 9.3 mg/dL (ref 8.7–10.2)
Chloride: 102 mmol/L (ref 96–106)
Creatinine, Ser: 0.62 mg/dL (ref 0.57–1.00)
Glucose: 110 mg/dL — ABNORMAL HIGH (ref 70–99)
Potassium: 4.7 mmol/L (ref 3.5–5.2)
Sodium: 138 mmol/L (ref 134–144)
eGFR: 113 mL/min/{1.73_m2} (ref >59)

## 2023-06-11 ENCOUNTER — Ambulatory Visit: Payer: 59

## 2023-06-17 ENCOUNTER — Encounter: Payer: 59 | Admitting: Student

## 2023-06-18 ENCOUNTER — Telehealth: Payer: Self-pay

## 2023-06-18 NOTE — Telephone Encounter (Signed)
Spoke to patient Left breast pain x 1 week. Soreness. Worse last couple of days.  Pain in nipple area left side to under breast.  No c/o discharge.  Offered an appointment for today. Refused.  Transferred to [place] to schedule next available appointment.

## 2023-06-18 NOTE — Telephone Encounter (Addendum)
Patient called she stated she has a appointment with the breast center for a mammogram on 07/31/23 patient is requesting a new referral for a mammogram screening and a ultrasound.

## 2023-06-26 ENCOUNTER — Ambulatory Visit: Payer: 59 | Admitting: Internal Medicine

## 2023-06-26 VITALS — BP 150/104 | HR 81 | Ht 63.0 in | Wt 237.3 lb

## 2023-06-26 DIAGNOSIS — N644 Mastodynia: Secondary | ICD-10-CM | POA: Insufficient documentation

## 2023-06-26 NOTE — Progress Notes (Deleted)
CC: left breast pain  HPI:  [name] is a 42 y.o. female living with a history stated below and presents today for left breast pain. Please see problem based assessment and plan for additional details.  Past Medical History:  Diagnosis Date   ADD (attention deficit disorder)    ADHD    Anemia    Cancer (HCC)    cervical cancer   Complication of anesthesia    unknown druf 2010 during surgery caused anaphylaxis. Had TAH at [facility] 2018 and were unable to obtain records form 2010 surgery. Did fine with 2018 TAH.   Depression    Deviated nasal septum    GERD (gastroesophageal reflux disease)    Heart murmur    had leaky mitral valve as child and also had rheumatic fever.   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   Panic attack    Pneumonia    Rheumatic fever     Current Outpatient Medications on File Prior to Visit  Medication Sig Dispense Refill   Buprenorphine HCl-Naloxone HCl (SUBOXONE) 8-2 MG FILM Place 1 Film under the tongue in the morning and at bedtime. 60 each 0   levothyroxine (SYNTHROID) 137 MCG tablet Take 1 tablet (137 mcg total) by mouth daily before breakfast. 30 tablet 0   losartan (COZAAR) 50 MG tablet Take 1 tablet (50 mg total) by mouth daily. 90 tablet 3   pantoprazole (PROTONIX) 40 MG tablet TAKE 1 TABLET(40 MG) BY MOUTH DAILY 30 tablet 0   phenazopyridine (PYRIDIUM) 100 MG tablet Take 1 tablet (100 mg total) by mouth 3 (three) times daily as needed for pain. 10 tablet 0   [DISCONTINUED] estradiol (ESTRACE) 1 MG tablet Take 1 tablet (1 mg total) by mouth 2 (two) times daily. 60 tablet 1   [DISCONTINUED] levothyroxine (SYNTHROID) 125 MCG tablet Take 1 tablet (125 mcg total) by mouth daily. (Patient taking differently: Take 137 mcg by mouth daily.) 30 tablet 11   No current  facility-administered medications on file prior to visit.    Family History  Problem Relation Age of Onset   Hypertension Mother    Thyroid disease Mother    Heart disease Father    Heart attack Maternal Grandmother    Heart attack Maternal Grandfather    Heart attack Paternal Grandmother    Heart attack Paternal Grandfather    Alcohol abuse Brother    ADD / ADHD Daughter    Strabismus Daughter    Migraines Daughter    Alcohol abuse Brother     Social History   Socioeconomic History   Marital status: Single    Spouse name: Not on file   Number of children: 3   Years of education: Associates   Highest education level: Not on file  Occupational History   Occupation: Investment banker, corporate  Tobacco Use   Smoking status: Light Smoker    Current packs/day: 0.50    Average packs/day: 0.5 packs/day for 20.0 years (10.0 ttl pk-yrs)    Types: Cigarettes, E-cigarettes   Smokeless tobacco: Never   Tobacco comments:    Vapes  1-2 cigs per month.  Vaping Use   Vaping status: Every Day   Substances: Nicotine  Substance and Sexual Activity   Alcohol use: Yes    Comment: social  Drug use: No   Sexual activity: Not Currently    Birth control/protection: None  Other Topics Concern   Not on file  Social History Narrative   Lives at home with daughter.   Right-handed.   2-3 cups caffeine per day.   Was engaged but lost fiance in car wreck in early 2020s.   Social Drivers of Corporate investment banker Strain: Not on file  Food Insecurity: Not on file  Transportation Needs: Not on file  Physical Activity: Not on file  Stress: Not on file  Social Connections: Unknown (07/18/2022)   Received from [facility]   Social Network    Social Network: Not on file  Intimate Partner Violence: Unknown (07/18/2022)   Received from [facility], [facility]   HITS    Physically Hurt: Not on file    Insult or Talk Down To: Not on file    Threaten Physical Harm: Not on file     Scream or Curse: Not on file    Review of Systems: ROS negative except for what is noted on the assessment and plan.  Vitals:   06/26/23 1410  Weight: 237 lb 4.8 oz (107.6 kg)   Physical Exam: Constitutional: well appearing HENT: unremarkable Eyes: unremarkable Cardiovascular: regular rate and rhythm, no m/r/g Pulmonary/Chest: CTA bilaterally, normal respiratory effort Abdominal: soft, non-tender, non-distended MSK: normal bulk and tone Neurological: alert & oriented x 3, no focal deficits Skin: warm and dry Psych: normal mood and behavior  Assessment & Plan:   Patient {GC/[id number]::"discussed with","seen with"} Dr. {[id number]::"FAOZHYQM","V. Hoffman","Mullen","Narendra","Vincent","Guilloud","Lau","Machen"}  No problem-specific Assessment & Plan notes found for this encounter.   Annett Fabian, MD  [facility] Internal Medicine, PGY-1 Phone: [phone] Date 06/26/2023 Time 2:10 PM

## 2023-06-26 NOTE — Progress Notes (Signed)
CC: left breast pain  HPI:  [name] is a 42 y.o. female living with a history stated below and presents today for an acute visit regarding left sided breast pain. Please see problem based assessment and plan for additional details.  Past Medical History:  Diagnosis Date   ADD (attention deficit disorder)    ADHD    Anemia    Cancer (HCC)    cervical cancer   Complication of anesthesia    unknown druf 2010 during surgery caused anaphylaxis. Had TAH at [facility] 2018 and were unable to obtain records form 2010 surgery. Did fine with 2018 TAH.   Depression    Deviated nasal septum    GERD (gastroesophageal reflux disease)    Heart murmur    had leaky mitral valve as child and also had rheumatic fever.   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   Panic attack    Pneumonia    Rheumatic fever     Current Outpatient Medications on File Prior to Visit  Medication Sig Dispense Refill   Buprenorphine HCl-Naloxone HCl (SUBOXONE) 8-2 MG FILM Place 1 Film under the tongue in the morning and at bedtime. 60 each 0   levothyroxine (SYNTHROID) 137 MCG tablet Take 1 tablet (137 mcg total) by mouth daily before breakfast. 30 tablet 0   losartan (COZAAR) 50 MG tablet Take 1 tablet (50 mg total) by mouth daily. 90 tablet 3   pantoprazole (PROTONIX) 40 MG tablet TAKE 1 TABLET(40 MG) BY MOUTH DAILY 30 tablet 0   phenazopyridine (PYRIDIUM) 100 MG tablet Take 1 tablet (100 mg total) by mouth 3 (three) times daily as needed for pain. 10 tablet 0   [DISCONTINUED] estradiol (ESTRACE) 1 MG tablet Take 1 tablet (1 mg total) by mouth 2 (two) times daily. 60 tablet 1   [DISCONTINUED] levothyroxine (SYNTHROID) 125 MCG tablet Take 1 tablet (125 mcg total) by mouth daily. (Patient taking differently: Take 137 mcg by mouth daily.) 30 tablet 11    No current facility-administered medications on file prior to visit.    Family History  Problem Relation Age of Onset   Hypertension Mother    Thyroid disease Mother    Heart disease Father    Heart attack Maternal Grandmother    Heart attack Maternal Grandfather    Heart attack Paternal Grandmother    Heart attack Paternal Grandfather    Alcohol abuse Brother    ADD / ADHD Daughter    Strabismus Daughter    Migraines Daughter    Alcohol abuse Brother     Social History   Socioeconomic History   Marital status: Single    Spouse name: Not on file   Number of children: 3   Years of education: Associates   Highest education level: Not on file  Occupational History   Occupation: Investment banker, corporate  Tobacco Use   Smoking status: Light Smoker    Current packs/day: 0.50    Average packs/day: 0.5 packs/day for 20.0 years (10.0 ttl pk-yrs)    Types: Cigarettes, E-cigarettes   Smokeless tobacco: Never   Tobacco comments:    Vapes  1-2 cigs per month.  Vaping Use   Vaping status: Every Day   Substances: Nicotine  Substance and Sexual Activity   Alcohol use: Yes  Comment: social   Drug use: No   Sexual activity: Not Currently    Birth control/protection: None  Other Topics Concern   Not on file  Social History Narrative   Lives at home with daughter.   Right-handed.   2-3 cups caffeine per day.   Was engaged but lost fiance in car wreck in early 2020s.   Social Drivers of Corporate investment banker Strain: Not on file  Food Insecurity: Not on file  Transportation Needs: Not on file  Physical Activity: Not on file  Stress: Not on file  Social Connections: Unknown (07/18/2022)   Received from [facility]   Social Network    Social Network: Not on file  Intimate Partner Violence: Unknown (07/18/2022)   Received from [facility], [facility]   HITS    Physically Hurt: Not on file    Insult or Talk Down To: Not on file    Threaten Physical  Harm: Not on file    Scream or Curse: Not on file    Review of Systems: ROS negative except for what is noted on the assessment and plan.  Vitals:   06/26/23 1410  BP: (!) 150/104  Pulse: 81  Weight: 237 lb 4.8 oz (107.6 kg)  Height: 5\' 3"  (1.6 m)    Physical Exam: Constitutional: well-appearing middle aged female sitting in chair, in no acute distress Pulmonary/Chest: normal work of breathing on room air Breast: the breasts are symmetric without skin changes or dimpling. The nipples are without discharge, lesions, scaling, or inversion. On palpation, the breast tissue is firm and dense bilaterally without discrete masses or lymphadenopathy appreciated. There is some tenderness to palpation below the left nipple, but no masses appreciated.  MSK: normal bulk and tone Neurological: alert & oriented x 3, no focal deficit Skin: warm and dry Psych: normal mood and behavior  Assessment & Plan:    Patient discussed with Dr. Sol Blazing  Breast pain, left The patient presents to the [facility] to discuss left sided breast pain. The patient states that for the last 2 weeks her left breast has felt sore/achey. She tried using a heating pad, as she was not sure if she slept the wrong way, but this did not relieve her pain. She has also tried taking ibuprofen, with very little relief. The patient states that the pain has been intermittent for the last 2 weeks, but does occur daily. She does not notice much pain when there is no pressure to the area, but does have significant pain if she touches her breast, under her nipple. Additionally, she describes having a significant pressure around the inframammary fold, stating that it feels like when her baby would kick during pregnancy. She denies any fevers, chills, redness/rash to the area, nipple discharge, or pain in the right breast.   On exam, her breasts are symmetric without skin changes or dimpling. The nipples are without discharge, lesions, scaling, or  inversion. On palpation, the breast tissue is firm and dense bilaterally without discrete masses or lymphadenopathy. There is some tenderness to palpation below the left nipple, but no masses appreciated. Breast exam was performed with chaperone, Hme Rcom, present.   Of note, the patient's last mammogram was in 2017. This was a diagnostic mammogram as she had breast pain at that time, although she does not remember having any pain. She was found to have 2 cysts on her right breast at that time and she has not had a mammogram since. Her  mother has a history of breast cysts, as well, and they do not have a family history of breast cancer.   Plan: - Diagnostic mammogram + ultrasound   Dory Demont, D.O. [facility] Internal Medicine, PGY-3 Phone: [phone] Date 06/26/2023 Time 3:13 PM

## 2023-06-26 NOTE — Assessment & Plan Note (Addendum)
The patient presents to the [facility] to discuss left sided breast pain. The patient states that for the last 2 weeks her left breast has felt sore/achey. She tried using a heating pad, as she was not sure if she slept the wrong way, but this did not relieve her pain. She has also tried taking ibuprofen, with very little relief. The patient states that the pain has been intermittent for the last 2 weeks, but does occur daily. She does not notice much pain when there is no pressure to the area, but does have significant pain if she touches her breast, under her nipple. Additionally, she describes having a significant pressure around the inframammary fold, stating that it feels like when her baby would kick during pregnancy. She denies any fevers, chills, redness/rash to the area, nipple discharge, or pain in the right breast.   On exam, her breasts are symmetric without skin changes or dimpling. The nipples are without discharge, lesions, scaling, or inversion. On palpation, the breast tissue is firm and dense bilaterally without discrete masses or lymphadenopathy. There is some tenderness to palpation below the left nipple, but no masses appreciated. Breast exam was performed with chaperone, Hme Rcom, present.   Of note, the patient's last mammogram was in 2017. This was a diagnostic mammogram as she had breast pain at that time, although she does not remember having any pain. She was found to have 2 cysts on her right breast at that time and she has not had a mammogram since. Her mother has a history of breast cysts, as well, and they do not have a family history of breast cancer.   Plan: - Diagnostic mammogram + ultrasound

## 2023-06-26 NOTE — Patient Instructions (Signed)
Thank you, Ms.Cletis Athens for allowing [place] to provide your care today. Today we discussed:  Left breast pain You can keep taking tylenol / ibuprofen as needed for this pain We are going to get a diagnostic mammogram and an ultrasound to evaluate the cause of your pain   I have ordered the following labs for you:  Lab Orders  No laboratory test(s) ordered today     Tests ordered today:  Diagnostic mammogram Left breast ultrasound including axilla  Referrals ordered today:   Referral Orders  No referral(s) requested today     I have ordered the following medication/changed the following medications:   Stop the following medications: There are no discontinued medications.   Start the following medications: No orders of the defined types were placed in this encounter.    Follow up:  4 weeks     Should you have any questions or concerns please call the internal medicine clinic at [phone].     Elza Rafter, D.O. [facility] Internal Medicine Center

## 2023-06-30 NOTE — Progress Notes (Signed)
Internal Medicine Clinic Attending ° °Case discussed with Dr. Atway  At the time of the visit.  We reviewed the resident’s history and exam and pertinent patient test results.  I agree with the assessment, diagnosis, and plan of care documented in the resident’s note.  °

## 2023-07-02 ENCOUNTER — Ambulatory Visit: Payer: 59 | Admitting: Internal Medicine

## 2023-07-02 VITALS — BP 126/88 | HR 63 | Temp 98.0°F | Ht 65.0 in | Wt 235.4 lb

## 2023-07-02 DIAGNOSIS — F1121 Opioid dependence, in remission: Secondary | ICD-10-CM

## 2023-07-02 DIAGNOSIS — I10 Essential (primary) hypertension: Secondary | ICD-10-CM | POA: Diagnosis not present

## 2023-07-02 DIAGNOSIS — L301 Dyshidrosis [pompholyx]: Secondary | ICD-10-CM | POA: Diagnosis not present

## 2023-07-02 DIAGNOSIS — L409 Psoriasis, unspecified: Secondary | ICD-10-CM | POA: Insufficient documentation

## 2023-07-02 DIAGNOSIS — L989 Disorder of the skin and subcutaneous tissue, unspecified: Secondary | ICD-10-CM | POA: Insufficient documentation

## 2023-07-02 MED ORDER — BUPRENORPHINE HCL-NALOXONE HCL 8-2 MG SL FILM: 1.0000 | ORAL_FILM | Freq: Two times a day (BID) | SUBLINGUAL | 0 refills | Status: DC

## 2023-07-02 MED ORDER — BETAMETHASONE DIPROPIONATE AUG 0.05 % EX CREA: TOPICAL_CREAM | Freq: Two times a day (BID) | CUTANEOUS | 0 refills | Status: DC

## 2023-07-02 NOTE — Patient Instructions (Addendum)
It was a pleasure to care for you today!  For the rash on your hands I have sent a cream called betamethasone. This is a high potency steroid cream that you will apply twice daily for two weeks. I would like you to be re-evaluated at that two week mark.  Additional things you should do:  ?Using lukewarm water and mild, synthetic detergent (nonsoap) cleansers to wash hands. ?Dry hands thoroughly after washing. ?Applying emollients (such as unscented lotion, aquaphor) immediately after hand drying and as often as possible. ?Wearing cotton gloves under vinyl or other nonlatex gloves when performing wet work. ?Removing rings, watches, and bracelets before wet work. ?Wearing protective gloves in cold weather. ?Wearing task-specific gloves for frictional exposures (eg, gardening, carpentry). ?Avoiding cutaneous exposure to irritants (eg, harsh detergents, solvents, hair dyes, acidic foods [eg, citrus fruit]).  I have also refilled your suboxone.  My best, Dr. August Saucer

## 2023-07-02 NOTE — Assessment & Plan Note (Signed)
Complaint of very dry, itchy skin isolated to her hands that began about one month ago. She denies any new soaps or chemical exposures. No new animal interactions or pets. She uses gloves when gardening so does not suspect exposure there. 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  She has tried applying lotions and aquaphor to keep the skin moisturized without improvement. She has applied hydrocortisone cream over the itchy areas without relief.  The rash was first noticed on her thumbs but in the last month has not gotten worse as far as distribution, however the skin is cracked in various areas due to extreme dryness.   No other people around her have this rash. Has not had this happen before. Some concern as she noticed the onset of the rash when she increased her losartan dose. No concern for angioedema. Assessment:I suspect dyshidrotic eczema is the etiology of her symptoms. Plan:Will treat with betamethasone 0.05% cream BID for two weeks with re-evaluation at that time. Counseled on general measures and included these on AVS.

## 2023-07-02 NOTE — Assessment & Plan Note (Signed)
Is on suboxone 8-2 mg BID. ToxAssure last checked 10/01 with expected results. Denies cravings or relapses. Plan:Refill suboxone today. Follow up in 1 month.

## 2023-07-02 NOTE — Progress Notes (Signed)
CC: suboxone f/u, rash on hands  HPI:  [name] is a 42 y.o. female with past medical history as detailed below who presents today for suboxone f/u and with concern for rash on her hands. Please see problem based charting for detailed assessment and plan.  Past Medical History:  Diagnosis Date   ADD (attention deficit disorder)    ADHD    Anemia    Cancer (HCC)    cervical cancer   Complication of anesthesia    unknown druf 2010 during surgery caused anaphylaxis. Had TAH at [facility] 2018 and were unable to obtain records form 2010 surgery. Did fine with 2018 TAH.   Depression    Deviated nasal septum    GERD (gastroesophageal reflux disease)    Heart murmur    had leaky mitral valve as child and also had rheumatic fever.   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   Panic attack    Pneumonia    Rheumatic fever    Review of Systems:  Negative unless otherwise stated.  Physical Exam:  Vitals:   07/02/23 1036 07/02/23 1045  BP: (!) 121/92 126/88  Pulse: 70 63  Temp: 98 F (36.7 C)   TempSrc: Oral   Weight: 235 lb 6.4 oz (106.8 kg)   Height: 5\' 5"  (1.651 m)    Constitutional:Well appearing, appears stated age. In no acute distress. HENT:No edema to tongue. Does have minimal edema to upper and lower inner buccal surfaces on the R that have no increased in size. Cardio:Regular rate and rhythm. No murmurs, rubs, or gallops. Pulm:Clear to auscultation bilaterally. Normal work of breathing on room air. NGE:[id number] for extremity edema. Skin:Warm and dry. Neuro:Alert and oriented x3. No focal deficit noted. Psych:Pleasant mood and affect.  Assessment & Plan:   See Encounters Tab for problem based charting.  Hypertension BP today 121/92. OP regimen is losartan 50 mg daily, increased in October. F/u visit 2  weeks later showed good control on this increased dose, no associated renal dysfunction with increased dose. Plan:Continue losartan 50 mg daily.  Moderate opioid use disorder in early remission on maintenance therapy (HCC) Is on suboxone 8-2 mg BID. ToxAssure last checked 10/01 with expected results. Denies cravings or relapses. Plan:Refill suboxone today. Follow up in 1 month.  Dyshidrotic eczema Complaint of very dry, itchy skin isolated to her hands that began about one month ago. She denies any new soaps or chemical exposures. No new animal interactions or pets. She uses gloves when gardening so does not suspect exposure there. 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  She has tried applying lotions and aquaphor to keep the skin moisturized without improvement. She has applied hydrocortisone cream over the itchy areas without relief.  The rash was first noticed on her thumbs but in the last month has not gotten worse as far as distribution, however the skin is cracked in various areas due to extreme dryness.   No other people around her have this rash. Has not had this happen before. Some concern as she noticed the onset of the rash when she increased her losartan dose. No concern for angioedema. Assessment:I suspect dyshidrotic eczema is the etiology of her symptoms. Plan:Will treat with betamethasone 0.05% cream BID for two weeks with re-evaluation at  that time. Counseled on general measures and included these on AVS.  Patient seen with Dr. Mikey Bussing

## 2023-07-02 NOTE — Assessment & Plan Note (Addendum)
BP today 121/92. OP regimen is losartan 50 mg daily, increased in October. F/u visit 2 weeks later showed good control on this increased dose, no associated renal dysfunction with increased dose. Plan:Continue losartan 50 mg daily.

## 2023-07-06 ENCOUNTER — Other Ambulatory Visit: Payer: Self-pay | Admitting: Student

## 2023-07-07 NOTE — Progress Notes (Signed)
Internal Medicine Clinic Attending  Case discussed with the resident at the time of the visit.  We reviewed the resident's history and exam and pertinent patient test results.  I agree with the assessment, diagnosis, and plan of care documented in the resident's note.  

## 2023-07-18 ENCOUNTER — Encounter: Payer: 59 | Admitting: Internal Medicine

## 2023-07-18 NOTE — Progress Notes (Deleted)
Dishydrotic eczema   oud

## 2023-07-29 ENCOUNTER — Other Ambulatory Visit: Payer: 59

## 2023-07-30 ENCOUNTER — Ambulatory Visit: Payer: 59 | Admitting: Student

## 2023-07-30 VITALS — BP 139/82 | HR 80 | Temp 97.8°F | Ht 65.0 in | Wt 236.9 lb

## 2023-07-30 DIAGNOSIS — R1013 Epigastric pain: Secondary | ICD-10-CM

## 2023-07-30 DIAGNOSIS — F1191 Opioid use, unspecified, in remission: Secondary | ICD-10-CM | POA: Diagnosis not present

## 2023-07-30 DIAGNOSIS — F1121 Opioid dependence, in remission: Secondary | ICD-10-CM

## 2023-07-30 DIAGNOSIS — K219 Gastro-esophageal reflux disease without esophagitis: Secondary | ICD-10-CM

## 2023-07-30 MED ORDER — SUCRALFATE 1 G PO TABS: 1.0000 g | ORAL_TABLET | Freq: Four times a day (QID) | ORAL | 1 refills | Status: DC

## 2023-07-30 MED ORDER — PANTOPRAZOLE SODIUM 40 MG PO TBEC: 40.0000 mg | DELAYED_RELEASE_TABLET | Freq: Two times a day (BID) | ORAL | 0 refills | Status: DC

## 2023-07-30 NOTE — Progress Notes (Signed)
 Established Patient Office Visit  Subjective   Patient ID: Laurie Hutchinson, female    DOB: [birth year]  Age: 43 y.o. MRN: [id number]  Chief Complaint  Patient presents with   Abdominal Pain    Laurie Hutchinson is a 43 y.o. who presents to the clinic for  a 1 month follow-up for Suboxone  use. Please see problem based assessment and plan for additional details.    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 (HCC) 01/20/2023   GERD (gastroesophageal reflux disease) 01/20/2023   Hypothyroidism 05/06/2019   Post menopausal problems 03/18/2019   Depression, major, single episode, in partial remission (HCC) 03/12/2017   Hot flashes due to surgical menopause 03/12/2017   Functional urinary incontinence 03/12/2017   Left leg paresthesias 10/30/2016       Objective:     BP 139/82 (BP Location: Right Arm, Patient Position: Sitting, Cuff Size: Normal)   Pulse 80   Temp 97.8 F (36.6 C) (Oral)   Ht 5\' 5"  (1.651 m)   Wt 236 lb 14.4 oz (107.5 kg)   LMP 10/04/2016 (Exact Date)   SpO2 99%   BMI 39.42 kg/m  BP Readings from Last 3 Encounters:  07/30/23 139/82  07/02/23 126/88  06/26/23 (!) 150/104      Physical Exam Vitals reviewed.  Constitutional:      General: She is not in acute distress.    Appearance: She is obese. She is not ill-appearing, toxic-appearing or diaphoretic.  Cardiovascular:     Rate and Rhythm: Normal rate and regular rhythm.  Pulmonary:     Effort: Pulmonary effort is normal. No respiratory distress.     Breath sounds: Normal breath sounds. No stridor. No wheezing, rhonchi or rales.  Chest:     Chest wall: No tenderness.  Abdominal:     General:  Abdomen is protuberant. Bowel sounds are normal. There is no distension.     Palpations: Abdomen is soft. There is no mass.     Tenderness: There is abdominal tenderness in the right lower quadrant, epigastric area and left upper quadrant. There is no guarding or rebound.     Hernia: There is no hernia in the umbilical area.  Skin:    General: Skin is warm and dry.  Neurological:     Mental Status: She is alert.  Psychiatric:        Mood and Affect: Mood normal.      No results found for any visits on 07/30/23.  Last CBC Lab Results  Component Value Date   WBC 9.2 03/18/2017   HGB 13.6 03/18/2017   HCT 40.9 03/18/2017   MCV 93.2 03/18/2017   MCH 31.0 03/18/2017   RDW 13.4 03/18/2017   PLT 251 03/18/2017   Last metabolic panel Lab Results  Component Value Date   GLUCOSE 110 (H) 05/27/2023   NA 138 05/27/2023   K 4.7 05/27/2023   CL 102 05/27/2023   CO2 25 05/27/2023   BUN 14 05/27/2023   CREATININE 0.62 05/27/2023   EGFR 113 05/27/2023   CALCIUM 9.3 05/27/2023   ANIONGAP 6 03/18/2017      The ASCVD Risk score (Arnett DK, et al., 2019) failed to calculate for the following reasons:  Cannot find a previous HDL lab   Cannot find a previous total cholesterol lab    Assessment & Plan:   Problem List Items Addressed This Visit       Digestive   GERD (gastroesophageal reflux disease)   Relevant Medications   sucralfate  (CARAFATE ) 1 g tablet   pantoprazole  (PROTONIX ) 40 MG tablet     Other   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  Prior tox assure was checked in October with expected results.  She denies recent cravings or relapses since her last office visit 1 month ago. Plan -Repeat tox assure this month -Will refill of Suboxone  is due on 1/22.      Relevant Orders   ToxAssure Select,+Antidepr,UR   Dyspepsia   Patient reports roughly 1 week of dyspepsia  described as burning and cramping in her abdomen primarily located near her umbilicus that radiates to her left and right.  Any oral intake worsens her abdominal pain and cramping.  She denies any symptoms of heartburn.  She stated that Protonix  does not help and that she has tried Tums which do help here and there.  She denied any constipation or diarrhea.  She denies blood in her stool, nausea, vomiting, new medications, new exposure to pets.  She denies alcohol use.  She also denies fevers, chills, prior evaluation of GI.  Patient has never had a colonoscopy and does not have family history of colon cancer.  Physical exam was remarkable for left upper quadrant tenderness.  I suspect that the patient has dyspepsia. Plan: -Will order CBC and evaluate for leukocytosis -Will order CMP and evaluate for hepatobiliary causes -Instructed patient to take Protonix  40 mg twice daily  -Instructed patient to take Carafate  4 times daily -Follow-up in 1 week. -Patient was instructed to either call the office or return to the clinic if she experiences systemic symptoms or worsening of her abdominal pain.      Relevant Orders   CBC no Diff   CMP14 + Anion Gap        Return in about 1 week (around 08/06/2023) for abdominal pain .    Aurora Lees, DO

## 2023-07-30 NOTE — Assessment & Plan Note (Signed)
 Patient is on Suboxone  8-2 mg twice daily.  Prior tox assure was checked in October with expected results.  She denies recent cravings or relapses since her last office visit 1 month ago. Plan -Repeat tox assure this month -Will refill of Suboxone  is due on 1/22.

## 2023-07-30 NOTE — Patient Instructions (Addendum)
 Thank you, Laurie Hutchinson for allowing us  to provide your care today. Today we discussed abdominal pain. Take the Carafate  four times a day for a total of 2 weeks.   I have ordered the following labs for you:   Lab Orders         ToxAssure Select,+Antidepr,UR         CBC no Diff         CMP14 + Anion Gap       Referrals ordered today:   Referral Orders  No referral(s) requested today     I have ordered the following medication/changed the following medications:   Stop the following medications: There are no discontinued medications.   Start the following medications: Meds ordered this encounter  Medications   sucralfate  (CARAFATE ) 1 g tablet    Sig: Take 1 tablet (1 g total) by mouth 4 (four) times daily.    Dispense:  120 tablet    Refill:  1     Follow up: 1 week    Remember: If fever, chills, worsening pain, or feeling worse; please call us  or go to the emergency department   Should you have any questions or concerns please call the internal medicine clinic at [phone].     Aurora Lees, D.O. [facility]

## 2023-07-30 NOTE — Assessment & Plan Note (Signed)
 Patient reports roughly 1 week of dyspepsia described as burning and cramping in her abdomen primarily located near her umbilicus that radiates to her left and right.  Any oral intake worsens her abdominal pain and cramping.  She denies any symptoms of heartburn.  She stated that Protonix  does not help and that she has tried Tums which do help here and there.  She denied any constipation or diarrhea.  She denies blood in her stool, nausea, vomiting, new medications, new exposure to pets.  She denies alcohol use.  She also denies fevers, chills, prior evaluation of GI.  Patient has never had a colonoscopy and does not have family history of colon cancer.  Physical exam was remarkable for left upper quadrant tenderness.  I suspect that the patient has dyspepsia. Plan: -Will order CBC and evaluate for leukocytosis -Will order CMP and evaluate for hepatobiliary causes -Instructed patient to take Protonix  40 mg twice daily  -Instructed patient to take Carafate  4 times daily -Follow-up in 1 week. -Patient was instructed to either call the office or return to the clinic if she experiences systemic symptoms or worsening of her abdominal pain.

## 2023-07-31 ENCOUNTER — Ambulatory Visit: Payer: 59

## 2023-07-31 LAB — CMP14 + ANION GAP
ALT: 19 [IU]/L (ref 0–32)
AST: 16 [IU]/L (ref 0–40)
Albumin: 4.5 g/dL (ref 3.9–4.9)
Alkaline Phosphatase: 71 [IU]/L (ref 44–121)
Anion Gap: 13 mmol/L (ref 10.0–18.0)
BUN/Creatinine Ratio: 18 (ref 9–23)
BUN: 13 mg/dL (ref 6–24)
Bilirubin Total: 0.3 mg/dL (ref 0.0–1.2)
CO2: 23 mmol/L (ref 20–29)
Calcium: 9.6 mg/dL (ref 8.7–10.2)
Chloride: 101 mmol/L (ref 96–106)
Creatinine, Ser: 0.74 mg/dL (ref 0.57–1.00)
Globulin, Total: 2.9 g/dL (ref 1.5–4.5)
Glucose: 96 mg/dL (ref 70–99)
Potassium: 4.5 mmol/L (ref 3.5–5.2)
Sodium: 137 mmol/L (ref 134–144)
Total Protein: 7.4 g/dL (ref 6.0–8.5)
eGFR: 103 mL/min/{1.73_m2} (ref >59)

## 2023-07-31 LAB — CBC
Hematocrit: 41.7 % (ref 34.0–46.6)
Hemoglobin: 13.4 g/dL (ref 11.1–15.9)
MCH: 29.2 pg (ref 26.6–33.0)
MCHC: 32.1 g/dL (ref 31.5–35.7)
MCV: 91 fL (ref 79–97)
Platelets: 269 10*3/uL (ref 150–450)
RBC: 4.59 x10E6/uL (ref 3.77–5.28)
RDW: 12.2 % (ref 11.7–15.4)
WBC: 6.5 10*3/uL (ref 3.4–10.8)

## 2023-07-31 NOTE — Progress Notes (Addendum)
Internal Medicine Clinic Attending  I was physically present during the key portions of the resident provided service and participated in the medical decision making of patient's management care. I reviewed pertinent patient test results.  The assessment, diagnosis, and plan were formulated together and I agree with the documentation in the resident's note.  Abdominal exam with TTP diffusely, epigastric and LUQ worse than elsewhere. No guarding or rebound. Describes post-prandial discomfort as burning/cramping with eating for one week associated with nausea. No vomiting, diarrhea, fevers. No urinary symptoms. Pt is s/p hysterectomy and oophorectomy. She appears non-toxic. Suspect dyspepsia vs biliary colic vs atypical gastroenteritis vs other. Lower suspicion for CMI given acuity of symptom and lower presence of risk factors. Will start with labs and consider imaging if unrevealing and symptoms persist. Symptomatic therapy for dyspepsia. Close f/u and return precautions provided.  Dickie La, MD

## 2023-07-31 NOTE — Addendum Note (Signed)
Addended by: Dickie La on: 07/31/2023 10:04 AM   Modules accepted: Level of Service

## 2023-08-01 ENCOUNTER — Telehealth: Payer: Self-pay

## 2023-08-01 NOTE — Telephone Encounter (Signed)
Pt states sucralfate (CARAFATE) 1 g tablet is causing her to have abdominal pain. It hurts all night long. Please call pt back.

## 2023-08-01 NOTE — Telephone Encounter (Signed)
Return pt's call who stated she started taking Carafate yesterday; her stomach started hurting. Stated it's prescribed 4 times a day. When she took it last night around 11 PM-12 MN - "I had the worse pain ever"; stated her stomach hurt all night,constant pain and still hurting this morning. Also c/o nausea. Pt instructed to stop the medication until she hears from the doctor. Pt saw Dr Hessie Diener 07/30/23.

## 2023-08-03 LAB — TOXASSURE SELECT,+ANTIDEPR,UR

## 2023-08-11 ENCOUNTER — Other Ambulatory Visit: Payer: Self-pay

## 2023-08-11 MED ORDER — BUPRENORPHINE HCL-NALOXONE HCL 8-2 MG SL FILM: 1.0000 | ORAL_FILM | Freq: Two times a day (BID) | SUBLINGUAL | 0 refills | Status: DC

## 2023-08-13 ENCOUNTER — Encounter: Payer: 59 | Admitting: Student

## 2023-08-14 ENCOUNTER — Ambulatory Visit: Payer: 59 | Admitting: Student

## 2023-08-14 VITALS — BP 126/84 | HR 68 | Temp 97.5°F | Wt 232.4 lb

## 2023-08-14 DIAGNOSIS — R1013 Epigastric pain: Secondary | ICD-10-CM

## 2023-08-14 NOTE — Progress Notes (Signed)
Established Patient Office Visit  Subjective   Patient ID: Laurie Hutchinson, female    DOB: 1981/05/30  Age: 43 y.o. MRN: [id number]  Chief Complaint  Patient presents with   Abdominal Pain    1 wk follow up  Still having pain     Laurie Hutchinson is a 43 y.o. who presents to the clinic for a two week follow up of abdominal pain. Please see problem based assessment and plan for additional details.  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 (HCC) 01/20/2023   GERD (gastroesophageal reflux disease) 01/20/2023   Hypothyroidism 05/06/2019   Post menopausal problems 03/18/2019   Depression, major, single episode, in partial remission (HCC) 03/12/2017   Hot flashes due to surgical menopause 03/12/2017   Functional urinary incontinence 03/12/2017   Left leg paresthesias 10/30/2016       Objective:     BP 126/84 (BP Location: Right Arm, Patient Position: Sitting, Cuff Size: Normal)   Pulse 68   Temp (!) 97.5 F (36.4 C) (Oral)   Wt 232 lb 6.4 oz (105.4 kg)   LMP 10/04/2016 (Exact Date)   BMI 38.67 kg/m  BP Readings from Last 3 Encounters:  08/14/23 126/84  07/30/23 139/82  07/02/23 126/88   Wt Readings from Last 3 Encounters:  08/14/23 232 lb 6.4 oz (105.4 kg)  07/30/23 236 lb 14.4 oz (107.5 kg)  07/02/23 235 lb 6.4 oz (106.8 kg)      Physical Exam Vitals reviewed.  Constitutional:      General: She is not in acute distress.    Appearance: She is obese. She is not ill-appearing, toxic-appearing or diaphoretic.  Cardiovascular:     Rate and Rhythm: Normal rate and regular rhythm.     Heart sounds: No murmur heard. Pulmonary:     Effort: Pulmonary effort is  normal. No respiratory distress.     Breath sounds: Normal breath sounds. No stridor. No wheezing or rhonchi.  Abdominal:     General: Abdomen is protuberant. Bowel sounds are normal. There is no distension.     Palpations: Abdomen is soft.     Tenderness: There is abdominal tenderness in the left upper quadrant. There is no guarding.  Skin:    General: Skin is warm and dry.  Neurological:     Mental Status: She is alert.  Psychiatric:        Mood and Affect: Mood normal.        Behavior: Behavior normal.      No results found for any visits on 08/14/23.  Last CBC Lab Results  Component Value Date   WBC 6.5 07/30/2023   HGB 13.4 07/30/2023   HCT 41.7 07/30/2023   MCV 91 07/30/2023   MCH 29.2 07/30/2023   RDW 12.2 07/30/2023   PLT 269 07/30/2023   Last metabolic panel Lab Results  Component Value Date   GLUCOSE 96 07/30/2023   NA 137 07/30/2023   K 4.5 07/30/2023   CL 101 07/30/2023   CO2 23 07/30/2023   BUN 13 07/30/2023   CREATININE 0.74 07/30/2023   EGFR 103 07/30/2023   CALCIUM 9.6 07/30/2023   PROT 7.4 07/30/2023   ALBUMIN 4.5 07/30/2023   LABGLOB 2.9  07/30/2023   BILITOT 0.3 07/30/2023   ALKPHOS 71 07/30/2023   AST 16 07/30/2023   ALT 19 07/30/2023   ANIONGAP 6 03/18/2017      Assessment & Plan:   Problem List Items Addressed This Visit       Other   Dyspepsia - Primary   Patient presents to clinic today for 2-week follow-up of abdominal pain described as dyspepsia.  Over the past 2 weeks, patient was taking Protonix 40 mg twice daily and Carafate twice daily.  Her abdominal pain has improved slightly but she is still experiencing abdominal pain throughout the day and is described more as epigastric today.  She is able to drink water okay and does experience postprandial pain and decreased appetite still.  Patient reports using 800 mg of ibuprofen numerous times per week up until about 1 to 2 months ago.  Patient is on Celebrex as well for rheumatoid  arthritis.  Patient denies alcohol use except for during holidays.  Physical exam was remarkable for left upper quadrant pain, exam did not reveal an acute abdomen.  I suspect that the patient has dyspepsia due to underlying gastric ulcer. Plan: -Urgent referral to GI placed -Patient was instructed to discontinue NSAID use including Celebrex -Patient was instructed to ask her rheumatologist to for an alternative medication for her RA. -Patient was informed of red flag symptoms of when to seek care in emergency department for a perforated ulcer.      Relevant Orders   Ambulatory referral to Gastroenterology     No follow-ups on file.    Faith Rogue, DO

## 2023-08-14 NOTE — Assessment & Plan Note (Addendum)
Patient presents to clinic today for 2-week follow-up of abdominal pain described as dyspepsia.  Over the past 2 weeks, patient was taking Protonix 40 mg twice daily and Carafate twice daily.  Her abdominal pain has improved slightly but she is still experiencing abdominal pain throughout the day and the pain is now described more as epigastric pain today.  She is able to drink water, but does experience postprandial pain and decreased appetite still.  Patient reports using 800 mg of ibuprofen numerous times per week up until about 1 to 2 months ago.  Patient is on Celebrex as well for rheumatoid arthritis.  Patient denies alcohol use except for during holidays.  Physical exam was remarkable for left upper quadrant pain, exam did not reveal an acute abdomen.  I suspect that the patient has dyspepsia due to underlying gastric ulcer. Plan: -Urgent referral to GI placed -Patient was instructed to discontinue NSAID use including Celebrex -Patient was instructed to ask her rheumatologist to for an alternative medication for her RA. -Patient was informed of red flag symptoms of when to seek care in emergency department for a perforated ulcer.

## 2023-08-14 NOTE — Patient Instructions (Addendum)
Thank you, [name] for allowing [place] to provide your care today. Today we discussed the abdominal pain, it does sound like you are describing a possible ulcer.  Please do not take any NSAIDs such as ibuprofen.  Please stop taking your Celebrex as well, contact your rheumatologist and let them know that we are unable to use NSAIDs due to this pain.  If your abdominal pain becomes more severe, you need to be evaluated the emergency department.  We have sent a referral to a gastroenterologist at this time.  Continue to avoid alcohol.  Taking the Protonix 40 mg twice a day, you may discontinue the Carafate.   Referrals ordered today:    Referral Orders         Ambulatory referral to Gastroenterology       Follow up: 1 month   Remember:  If your abdominal pain becomes more severe, you need to be evaluated the emergency department.   Should you have any questions or concerns please call the internal medicine clinic at [phone].     Please note that our late policy has changed.  If you are more than 15 minutes late to your appointment, you may be asked to reschedule your appointment.  Dr. Hessie Diener, D.O. [facility]

## 2023-08-18 ENCOUNTER — Telehealth: Payer: Self-pay | Admitting: *Deleted

## 2023-08-18 NOTE — Progress Notes (Signed)
 Internal Medicine Clinic Attending  Case discussed with the resident at the time of the visit.  We reviewed the resident's history and exam and pertinent patient test results.  I agree with the assessment, diagnosis, and plan of care documented in the resident's note.

## 2023-08-18 NOTE — Telephone Encounter (Signed)
Spoke with patient regarding her mammogram scheduling. Patient states she had appointment but had to cancel, states she will call and make appointment. She is aware of the $75 no show fee charge.

## 2023-08-19 ENCOUNTER — Telehealth: Payer: Self-pay | Admitting: *Deleted

## 2023-08-19 NOTE — Telephone Encounter (Signed)
See phone note 08-18-2023 regarding mammogram.

## 2023-09-04 ENCOUNTER — Ambulatory Visit: Payer: 59 | Admitting: Internal Medicine

## 2023-09-04 ENCOUNTER — Encounter (INDEPENDENT_AMBULATORY_CARE_PROVIDER_SITE_OTHER): Payer: Self-pay

## 2023-09-05 ENCOUNTER — Telehealth: Payer: Self-pay | Admitting: Internal Medicine

## 2023-09-05 NOTE — Telephone Encounter (Signed)
Left a message to rescheduled the appt cx on 09/04/23 with Dr. Marletta Lor

## 2023-09-12 ENCOUNTER — Ambulatory Visit: Payer: 59 | Admitting: Student

## 2023-09-12 VITALS — BP 130/78 | HR 84 | Temp 97.9°F | Ht 65.0 in | Wt 233.7 lb

## 2023-09-12 DIAGNOSIS — F1121 Opioid dependence, in remission: Secondary | ICD-10-CM | POA: Diagnosis not present

## 2023-09-12 DIAGNOSIS — L989 Disorder of the skin and subcutaneous tissue, unspecified: Secondary | ICD-10-CM

## 2023-09-12 DIAGNOSIS — E039 Hypothyroidism, unspecified: Secondary | ICD-10-CM | POA: Diagnosis not present

## 2023-09-12 DIAGNOSIS — M79642 Pain in left hand: Secondary | ICD-10-CM

## 2023-09-12 DIAGNOSIS — R234 Changes in skin texture: Secondary | ICD-10-CM

## 2023-09-12 DIAGNOSIS — M259 Joint disorder, unspecified: Secondary | ICD-10-CM

## 2023-09-12 DIAGNOSIS — M7989 Other specified soft tissue disorders: Secondary | ICD-10-CM

## 2023-09-12 DIAGNOSIS — R1013 Epigastric pain: Secondary | ICD-10-CM

## 2023-09-12 DIAGNOSIS — I10 Essential (primary) hypertension: Secondary | ICD-10-CM

## 2023-09-12 DIAGNOSIS — L299 Pruritus, unspecified: Secondary | ICD-10-CM

## 2023-09-12 DIAGNOSIS — M79641 Pain in right hand: Secondary | ICD-10-CM

## 2023-09-12 MED ORDER — BUPRENORPHINE HCL-NALOXONE HCL 8-2 MG SL FILM: 1.0000 | ORAL_FILM | Freq: Two times a day (BID) | SUBLINGUAL | 0 refills | Status: DC

## 2023-09-12 MED ORDER — LEVOTHYROXINE SODIUM 137 MCG PO TABS: 137.0000 ug | ORAL_TABLET | Freq: Every day | ORAL | 3 refills | Status: AC

## 2023-09-12 NOTE — Progress Notes (Signed)
   CC: Routine Follow Up for dyspepsia after last office visit 08/14/2023  HPI:  Laurie Hutchinson is a 43 y.o. female with pertinent PMH of HTN, hypothyroidism, prediabetes, OUD, and GERD who presents as above. Please see assessment and plan below for further details.  Review of Systems:   Pertinent items noted in HPI and/or A&P.  Physical Exam:  Vitals:   09/12/23 0932  BP: 130/78  Pulse: 84  Temp: 97.9 F (36.6 C)  TempSrc: Oral  SpO2: 98%  Weight: 233 lb 11.2 oz (106 kg)  Height: 5\' 5"  (1.651 m)    Constitutional: Well-appearing adult female. In no acute distress. HEENT: Normocephalic, atraumatic, Sclera non-icteric, PERRL, EOM intact Cardio:Regular rate and rhythm. 2+ bilateral radial pulses. Pulm:Clear to auscultation bilaterally. Normal work of breathing on room air. MSK/skin: The skin over bilateral hands appears then and has multiple scattered erythematous patches along with erythema down the midline ventral side of her digits extending partially into the palm and mild joint swelling throughout both hands.  Skin is cracked and dry in the ventral digital joint lines.  No other rashes noted or inflamed/tender joints in the upper extremities.  Stable bilateral knee joint tenderness which is very mild today. Neuro:Alert and oriented x3. No focal deficit noted. Psych:Pleasant mood and affect.        Assessment & Plan:   Hypertension Blood pressure has been well-controlled on losartan.  138/78 today.  Last metabolic panel with stable electrolytes and kidney function on 07/30/2023. - Continue losartan 50 mg daily  Hypothyroidism Currently on Synthroid 137 mcg daily with symptoms at 125 mcg in the past.  TSH 3.99 on 05/13/2023 after increasing back to 137 mcg about 3 weeks prior.  Symptoms continue to be stable on current dose. 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  Last tox assure was appropriate 07/30/2023.  Last Suboxone fill 08/28/2023 for 30 days, next fill 09/23/2023. - Continue Suboxone 8-2 mg twice daily and continue to monitor tox assure every 6 months  Skin and joint disorder Patient continues to have progressively worsening skin and joint abnormalities on bilateral hands.  Symptoms predominantly include skin cracking on the ventral digital joint lines, thin and sensitive skin to pain, itching, and hand and finger swelling with erythematous lines vertically on the ventral side of the digits.  She has a history of a positive ANA and positive anti-CCP.  Overall history and exam is concerning for both skin and joint inflammatory condition however has not been responsive to first-line therapies primarily aimed at the dermatologic component with topical steroids and over-the-counter ointments and creams.  She has a rheumatologist.  Currently does not fit and illness script for common conditions like psoriasis, rheumatoid arthritis, contact dermatitis, and has not responded to treatment for eczema. - Continue with current management and urged to follow-up with rheumatology as soon as she is able and referral for dermatology sent - ESR and CRP today - Return in 1 month for reevaluation  Dyspepsia Currently has a follow-up appointment with GI on 10/02/2023.    Patient discussed with Dr. Duwayne Heck, DO [facility] Internal [facility] Resident PGY-2 Clinic Phone: [phone] Pager: [phone]

## 2023-09-12 NOTE — Assessment & Plan Note (Signed)
 Currently on Suboxone 8-2 mg twice daily with good control of symptoms and no relapses.  Last tox assure was appropriate 07/30/2023.  Last Suboxone fill 08/28/2023 for 30 days, next fill 09/23/2023. - Continue Suboxone 8-2 mg twice daily and continue to monitor tox assure every 6 months

## 2023-09-12 NOTE — Assessment & Plan Note (Signed)
 Currently has a follow-up appointment with GI on 10/02/2023.

## 2023-09-12 NOTE — Patient Instructions (Signed)
  Thank you, [name], for allowing [place] to provide your care today.  I am glad that your blood pressure, thyroid, and OUD are well-controlled and you are doing a great job with them.  I am also glad that you have your follow-up with gastroenterology on 10/02/2023.  For your hands I am not quite sure what exactly is the driver of your symptoms because they do not fit very well within a certain joint or skin disease.  For that reason I want you to call your rheumatologist and get an earlier appointment to be seen and we are going to draw labs today to check for inflammation.  I am also going to send you to dermatology but this might take a few weeks to get an appointment and I think the rheumatology follow-up will be more important.  In the meantime continue with your current management of your symptoms and if you think the steroid cream gave you any relief we will be happy to send it in again.  We will see you back in about 1 month to reevaluate and discuss any further developments in your plan.  I have ordered the following labs for you:  Lab Orders         Sed Rate (ESR)         CRP (C-Reactive Protein)      Referrals ordered today:   Referral Orders         Ambulatory referral to Dermatology        I have ordered the following medication/changed the following medications:   Stop the following medications: Medications Discontinued During This Encounter  Medication Reason   phenazopyridine (PYRIDIUM) 100 MG tablet    levothyroxine (SYNTHROID) 137 MCG tablet Reorder   Buprenorphine HCl-Naloxone HCl (SUBOXONE) 8-2 MG FILM Reorder     Start the following medications: Meds ordered this encounter  Medications   Buprenorphine HCl-Naloxone HCl (SUBOXONE) 8-2 MG FILM    Sig: Place 1 Film under the tongue in the morning and at bedtime.    Dispense:  60 each    Refill:  0   levothyroxine (SYNTHROID) 137 MCG tablet    Sig: Take 1 tablet (137 mcg total) by mouth daily before  breakfast.    Dispense:  90 tablet    Refill:  3      Follow up:  1 month     Remember:     Should you have any questions or concerns please call the internal medicine clinic at [phone].     Rocky Morel, DO [facility]

## 2023-09-12 NOTE — Assessment & Plan Note (Signed)
 Blood pressure has been well-controlled on losartan.  138/78 today.  Last metabolic panel with stable electrolytes and kidney function on 07/30/2023. - Continue losartan 50 mg daily

## 2023-09-12 NOTE — Assessment & Plan Note (Signed)
 Currently on Synthroid 137 mcg daily with symptoms at 125 mcg in the past.  TSH 3.99 on 05/13/2023 after increasing back to 137 mcg about 3 weeks prior.  Symptoms continue to be stable on current dose. - Continue Synthroid 137 mcg daily

## 2023-09-12 NOTE — Assessment & Plan Note (Signed)
 Patient continues to have progressively worsening skin and joint abnormalities on bilateral hands.  Symptoms predominantly include skin cracking on the ventral digital joint lines, thin and sensitive skin to pain, itching, and hand and finger swelling with erythematous lines vertically on the ventral side of the digits.  She has a history of a positive ANA and positive anti-CCP.  Overall history and exam is concerning for both skin and joint inflammatory condition however has not been responsive to first-line therapies primarily aimed at the dermatologic component with topical steroids and over-the-counter ointments and creams.  She has a rheumatologist.  Currently does not fit and illness script for common conditions like psoriasis, rheumatoid arthritis, contact dermatitis, and has not responded to treatment for eczema. - Continue with current management and urged to follow-up with rheumatology as soon as she is able and referral for dermatology sent - ESR and CRP today - Return in 1 month for reevaluation

## 2023-09-14 LAB — C-REACTIVE PROTEIN: CRP: 6 mg/L (ref 0–10)

## 2023-09-14 LAB — SEDIMENTATION RATE: Sed Rate: 8 mm/h (ref 0–32)

## 2023-09-18 ENCOUNTER — Encounter: Payer: Self-pay | Admitting: Student

## 2023-09-18 ENCOUNTER — Telehealth: Payer: Self-pay | Admitting: *Deleted

## 2023-09-18 NOTE — Progress Notes (Signed)
 Internal Medicine Clinic Attending  Case discussed with the resident at the time of the visit.  We reviewed the resident's history and exam and pertinent patient test results.  I agree with the assessment, diagnosis, and plan of care documented in the resident's note.

## 2023-09-18 NOTE — Progress Notes (Signed)
 ESR/CRP normal. Recommend continuing current plan of rheumatology and dermatology follow up.

## 2023-09-18 NOTE — Telephone Encounter (Signed)
 Mammogram/U/S of breast appointment /April 9.2025 @ 9:00 am arrive 8:30 am. Patient is aware of the 75.00 no show fee. To call breast center at [phone] if she unable to keep the appointment, appointment also mailed to the patient.

## 2023-10-17 ENCOUNTER — Encounter: Payer: 59 | Admitting: Student

## 2023-10-22 ENCOUNTER — Inpatient Hospital Stay: Admission: RE | Admit: 2023-10-22 | Source: Ambulatory Visit

## 2023-10-22 ENCOUNTER — Other Ambulatory Visit

## 2023-10-29 ENCOUNTER — Ambulatory Visit: Admitting: Student

## 2023-10-29 VITALS — BP 150/68 | HR 64 | Temp 97.7°F | Ht 65.0 in | Wt 228.3 lb

## 2023-10-29 DIAGNOSIS — F1121 Opioid dependence, in remission: Secondary | ICD-10-CM

## 2023-10-29 DIAGNOSIS — L409 Psoriasis, unspecified: Secondary | ICD-10-CM

## 2023-10-29 DIAGNOSIS — R059 Cough, unspecified: Secondary | ICD-10-CM | POA: Diagnosis not present

## 2023-10-29 DIAGNOSIS — R052 Subacute cough: Secondary | ICD-10-CM

## 2023-10-29 MED ORDER — FLUTICASONE PROPIONATE 50 MCG/ACT NA SUSP: 1.0000 | Freq: Every day | NASAL | 0 refills | Status: AC

## 2023-10-29 MED ORDER — BUPRENORPHINE HCL-NALOXONE HCL 8-2 MG SL FILM: 1.0000 | ORAL_FILM | Freq: Two times a day (BID) | SUBLINGUAL | 0 refills | Status: DC

## 2023-10-29 MED ORDER — FEXOFENADINE HCL 180 MG PO TABS: 180.0000 mg | ORAL_TABLET | Freq: Every day | ORAL | 3 refills | Status: AC

## 2023-10-29 NOTE — Progress Notes (Signed)
 Internal Medicine Clinic Attending  Case discussed with the resident at the time of the visit.  We reviewed the resident's history and exam and pertinent patient test results.  I agree with the assessment, diagnosis, and plan of care documented in the resident's note.

## 2023-10-29 NOTE — Assessment & Plan Note (Signed)
 Patient presents with a history of opoid use disorder on suboxone 8-2mg  mg BID. Patient denies adverse effects including nausea, vomiting, constipation. They deny relapses or cravings.  Plan: -Contract UTD -Prior toxassure was in January and was appropriate  -Will start to space out toxassures to 6 months -PDMP reviewed, suboxone refilled

## 2023-10-29 NOTE — Assessment & Plan Note (Signed)
 Patient was evaluated by Dermatology who diagnosed the patient with Psoarasis. Patient's sx are improving on the regimen of calcipotriene (DOVONOX) 0.005 % cream and halobetasol (ULTRAVATE) 0.05 % cream.  Plan: -Continue follow up with dermatology: Next appointment is in May

## 2023-10-29 NOTE — Progress Notes (Signed)
 Established Patient Office Visit  Subjective   Patient ID: Laurie Hutchinson, female    DOB: 07-26-1980  Age: 43 y.o. MRN: [id number]  Chief Complaint  Patient presents with   Medication Refill    Laurie Hutchinson is a 43 y.o. who presents to the clinic for a follow up of OUD and new cough. Please see problem based assessment and plan for additional details.   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/12/2017   Hot flashes due to surgical menopause 03/12/2017   Functional urinary incontinence 03/12/2017   Left leg paresthesias 10/30/2016     Objective:     BP (!) 150/68 (BP Location: Right Arm, Patient Position: Sitting, Cuff Size: Small)   Pulse 64   Temp 97.7 F (36.5 C) (Oral)   Ht 5\' 5"  (1.651 m)   Wt 228 lb 4.8 oz (103.6 kg)   LMP 10/04/2016 (Exact Date)   SpO2 96%   BMI 37.99 kg/m  BP Readings from Last 3 Encounters:  10/29/23 (!) 150/68  09/12/23 130/78  08/14/23 126/84   Wt Readings from Last 3 Encounters:  10/29/23 228 lb 4.8 oz (103.6 kg)  09/12/23 233 lb 11.2 oz (106 kg)  08/14/23 232 lb 6.4 oz (105.4 kg)      Physical Exam Vitals reviewed.  Constitutional:      General: She is not in acute distress.    Appearance: She is not ill-appearing, toxic-appearing or diaphoretic.  Cardiovascular:     Rate and Rhythm: Normal rate and regular rhythm.     Heart sounds: Normal heart sounds.  Pulmonary:     Effort: Pulmonary effort is normal.  No respiratory distress.     Breath sounds: Normal breath sounds. No wheezing or rales.  Musculoskeletal:     Right lower leg: No edema.     Left lower leg: No edema.  Skin:    General: Skin is warm and dry.  Neurological:     Mental Status: She is alert.      Last metabolic panel Lab Results  Component Value Date   GLUCOSE 96 07/30/2023   NA 137 07/30/2023   K 4.5 07/30/2023   CL 101 07/30/2023   CO2 23 07/30/2023   BUN 13 07/30/2023   CREATININE 0.74 07/30/2023   EGFR 103 07/30/2023   CALCIUM 9.6 07/30/2023   PROT 7.4 07/30/2023   ALBUMIN 4.5 07/30/2023   LABGLOB 2.9 07/30/2023   BILITOT 0.3 07/30/2023   ALKPHOS 71 07/30/2023   AST 16 07/30/2023   ALT 19 07/30/2023   ANIONGAP 6 03/18/2017   Last lipids Lab Results  Component Value Date   CHOL 143 01/08/2018   LDLCALC 70 01/08/2018   TRIG 97 01/08/2018   Last hemoglobin A1c Lab Results  Component Value Date   HGBA1C 5.7 (A) 02/18/2023      The ASCVD Risk score (Arnett DK, et al., 2019) failed to calculate for the following reasons:   Cannot find a previous HDL lab   Cannot find a previous total  cholesterol lab    Assessment & Plan:   Problem List Items Addressed This Visit       Musculoskeletal and Integument   Psoriasis - Primary   Patient was evaluated by Dermatology who diagnosed the patient with Psoarasis. Patient's sx are improving on the regimen of calcipotriene (DOVONOX) 0.005 % cream and halobetasol (ULTRAVATE) 0.05 % cream.  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 Patient denies adverse effects including nausea, vomiting, constipation. They deny relapses or cravings.  Plan: -Contract UTD -Prior toxassure was in January and was appropriate  -Will start to space out toxassures to 6  months -PDMP reviewed, suboxone refilled        Relevant Medications   Buprenorphine HCl-Naloxone HCl (SUBOXONE) 8-2 MG FILM   Cough   Patient reports a non-productive cough for one month duration. She reported that the cough is worse at night and she will have subjective wheezing at night as well. A prior event similar to this happened in the past when she has allergies and "sinus problems". She denies shortness of breath, fever, chills. She does report a 10 pack year history. On exam, there is no wheezes or rales, oxygen saturation is 96%.  I suspect that her cough is due to post nasal drip with the current allergies. Low suspicion for COPD at this moment.  Plan: -Flonase, patient educated on proper use of Flonase -Allegra daily -Patient asked to check back in with us  in 2-4 weeks if the cough does not improve: If cough does not improve, will extend work up to CXR        Return in about 3 months (around 01/28/2024) for OUD.    Aurora Lees, DO

## 2023-10-29 NOTE — Assessment & Plan Note (Signed)
 Patient reports a non-productive cough for one month duration. She reported that the cough is worse at night and she will have subjective wheezing at night as well. A prior event similar to this happened in the past when she has allergies and "sinus problems". She denies shortness of breath, fever, chills. She does report a 10 pack year history. On exam, there is no wheezes or rales, oxygen saturation is 96%.  I suspect that her cough is due to post nasal drip with the current allergies. Low suspicion for COPD at this moment.  Plan: -Flonase, patient educated on proper use of Flonase -Allegra daily -Patient asked to check back in with us  in 2-4 weeks if the cough does not improve: If cough does not improve, will extend work up to CXR

## 2023-10-29 NOTE — Patient Instructions (Addendum)
 Thank you, Ms.Thomasene Flemings for allowing us  to provide your care today. Today we discussed cough and suboxone use.   Take one allegra per day and purchase flonase. For the flonase, be sure to watch a youtube video on how to properly use the flonase.!   I have ordered the following medication/changed the following medications:   Stop the following medications: Medications Discontinued During This Encounter  Medication Reason   augmented betamethasone dipropionate (DIPROLENE-AF) 0.05 % cream Completed Course     Start the following medications: No orders of the defined types were placed in this encounter.    Follow up: 3 months    Remember: To call if the allegra and flonase do not help with the cough.   Should you have any questions or concerns please call the internal medicine clinic at [phone].     Please note that our late policy has changed.  If you are more than 15 minutes late to your appointment, you may be asked to reschedule your appointment.  Dr. Sharlon Deacon, D.O. [facility]

## 2023-11-05 ENCOUNTER — Ambulatory Visit: Payer: Self-pay

## 2023-11-05 NOTE — Telephone Encounter (Signed)
  Chief Complaint: Prescription issue Symptoms: NA Frequency: NA Pertinent Negatives: Patient denies NA Disposition: [] ED /[] Urgent Care (no appt availability in office) / [] Appointment(In office/virtual)/ []  [place] [facility]/ [] Home Care/ [] Refused Recommended Disposition /[] [place] [facility]/ []  Follow-up with PCP Additional Notes:   Pharmacy requesting DEA number for Suboxone  prescription. Called to [employer] [facility] [employer] and spoke with Pattie Borders, provided information as requested. Prescription will be ready for patient pick up by 130pm today. This Clinical research associate called patient to inform her that prescription will be ready today. Patient thankful for follow up.    Copied from CRM [id number]. Topic: Clinical - Prescription Issue >> Nov 05, 2023  8:57 AM Hamdi H wrote: Reason for CRM: Patient is unable to get her Buprenorphine  HCl-Naloxone  HCl (SUBOXONE ) 8-2 MG FILM filled due to the providers DEA number being missing. The pharmacy said that the patients insurance is rejecting this prescription refill because of the missing DEA number. The pharmacy needs to know either Dr. Evetta Hoehn DEA number or the attending doctor's DEA number.  Please contact the pharmacy for the patient: [id number] - [place], [place] - [address] [place] [address] Phone: [phone] Fax: [phone] Reason for Disposition  Caller requesting a CONTROLLED substance prescription refill (e.g., narcotics, ADHD medicines)  Protocols used: Medication Refill and Renewal Call-A-AH

## 2023-11-13 ENCOUNTER — Telehealth: Payer: Self-pay | Admitting: *Deleted

## 2023-11-13 NOTE — Telephone Encounter (Signed)
 This  mammogram appt. (10-22-2023 @ 9:00 am)  has been canceled

## 2023-12-09 ENCOUNTER — Telehealth: Payer: Self-pay

## 2023-12-09 ENCOUNTER — Other Ambulatory Visit: Payer: Self-pay | Admitting: Student

## 2023-12-09 DIAGNOSIS — F1121 Opioid dependence, in remission: Secondary | ICD-10-CM

## 2023-12-09 NOTE — Telephone Encounter (Unsigned)
 Copied from CRM [id number]. Topic: Clinical - Medication Refill >> Dec 09, 2023  4:12 PM Danelle Dunning F wrote: Patient is calling in to get the below prescription send in by an attending provider due to the residents not being listed to submit a controlled prescription.    Medication: Buprenorphine  HCl-Naloxone  HCl (SUBOXONE ) 8-2 MG FILM  Has the patient contacted their pharmacy? No   This is the patient's preferred pharmacy:  [id number] - [place], [place] - [address] [place] [address] Phone: [phone] Fax: [phone]  Is this the correct pharmacy for this prescription? Yes   Has the prescription been filled recently? No  Is the patient out of the medication? No  Has the patient been seen for an appointment in the last year OR does the patient have an upcoming appointment? Yes  Can we respond through MyChart? Yes  Agent: Please be advised that Rx refills may take up to 3 business days. We ask that you follow-up with your pharmacy.

## 2023-12-09 NOTE — Telephone Encounter (Signed)
 Copied from CRM [id number]. Topic: Clinical - Medical Advice >> Dec 09, 2023  4:19 PM Danelle Dunning F wrote: Reason for CRM:   Patient called in to schedule her 2 month follow up; If an appointment is not necessary patient would like for the office to reach out to cancel her appointment scheduled on June 27th at 9:45am.  Callback Number: [phone] >> Dec 09, 2023  4:22 PM Danelle Dunning F wrote: Please note: The appointment was scheduled due to checking in with the patient about her suboxone  prescription; Patient stated she is charged $50 per visit so if a visit can be avoided she would like to have the visit cancelled and for her to be notified but she is willing to come in on the date scheduled.

## 2023-12-10 NOTE — Telephone Encounter (Signed)
 Patient was last seen 10/29/23, she was instructed to Return in about 3 months (around 01/28/2024) for OUD. I attempted to call the patient I was unable to reach her, I lvm for her stating that she will need to keep her appointment as instructed.

## 2023-12-12 MED ORDER — BUPRENORPHINE HCL-NALOXONE HCL 8-2 MG SL FILM: 1.0000 | ORAL_FILM | Freq: Two times a day (BID) | SUBLINGUAL | 0 refills | Status: DC

## 2024-01-09 ENCOUNTER — Ambulatory Visit: Payer: Self-pay | Admitting: Student

## 2024-01-09 VITALS — BP 125/85 | HR 87 | Temp 98.0°F | Ht 64.0 in | Wt 217.0 lb

## 2024-01-09 DIAGNOSIS — L409 Psoriasis, unspecified: Secondary | ICD-10-CM

## 2024-01-09 DIAGNOSIS — F1121 Opioid dependence, in remission: Secondary | ICD-10-CM | POA: Diagnosis not present

## 2024-01-09 MED ORDER — BUPRENORPHINE HCL-NALOXONE HCL 8-2 MG SL FILM: 1.0000 | ORAL_FILM | Freq: Two times a day (BID) | SUBLINGUAL | 0 refills | Status: DC

## 2024-01-09 NOTE — Patient Instructions (Signed)
 Return in about 3 months (around 04/10/2024).   Remember to bring all of the medications that you take (including over the counter medications and supplements) with you to every clinic visit.  This after visit summary is an important review of tests, referrals, and medication changes that were discussed during your visit. If you have questions or concerns, call [phone]. Outside of clinic business hours, call the main hospital at [phone] and ask the operator for the on-call internal medicine resident.   Ozell Kung MD 01/09/2024, 10:30 AM

## 2024-01-09 NOTE — Progress Notes (Signed)
 Internal Medicine Clinic Attending  Case discussed with the resident at the time of the visit.  We reviewed the resident's history and exam and pertinent patient test results.  I agree with the assessment, diagnosis, and plan of care documented in the resident's note.

## 2024-01-09 NOTE — Assessment & Plan Note (Signed)
 Worsening, especially on palms. On calcipotriol and halobetasol per dermatology but not helping. Requests referral to Dr. Livingston of dermatology, wonder if she's an immunotherapy candidate.

## 2024-01-09 NOTE — Assessment & Plan Note (Signed)
 Stable. Doing well. CBD usage doesn't seem problematic to me. No relapse or craving. Toxassure today. PDMP reviewed, buprenorphine -naloxone  8-2 mg 1 film BID x 30 days. Follow-up in 3 months.

## 2024-01-09 NOTE — Progress Notes (Signed)
 Patient name: Laurie Hutchinson Date of birth: 1980-09-29 Date of visit: 01/09/24  Subjective   Chief concern: cream for my psoriasis on my hands isn't helping  Dissatisfied with the treatment for her psoriasis thus far. Requests referral to Dr. Livingston.  Doing well on her Suboxone . Uses a CBD vape pen and pre-rolls occasionally but doesn't feel usage is problematic.  Review of Systems  Psychiatric/Behavioral:  Negative for depression and substance abuse.     Current Outpatient Medications  Medication Instructions   Buprenorphine  HCl-Naloxone  HCl (SUBOXONE ) 8-2 MG FILM 1 Film, Sublingual, 2 times daily   calcipotriene (DOVONOX) 0.005 % cream 1 Application, Daily   celecoxib (CELEBREX) 100 mg, 2 times daily   fexofenadine  (ALLEGRA  ALLERGY) 180 mg, Oral, Daily   fluticasone  (FLONASE ) 50 MCG/ACT nasal spray 1 spray, Each Nare, Daily   halobetasol (ULTRAVATE) 0.05 % cream 1 each, 2 times daily   levothyroxine  (SYNTHROID ) 137 mcg, Oral, Daily before breakfast   losartan  (COZAAR ) 50 mg, Oral, Daily   mupirocin ointment (BACTROBAN) 2 % 1 Application, 2 times daily   pantoprazole  (PROTONIX ) 40 mg, Oral, 2 times daily     Objective  Today's Vitals   01/09/24 1006 01/09/24 1014  BP: (!) 152/107 125/85  Pulse: 88 87  Temp: 98 F (36.7 C)   TempSrc: Oral   SpO2: 90%   Weight: 217 lb (98.4 kg)   Height: 5' 4 (1.626 m)   PainSc: 0-No pain   Body mass index is 37.25 kg/m.   Physical Exam Constitutional:      General: She is not in acute distress.    Appearance: Normal appearance.   Cardiovascular:     Rate and Rhythm: Normal rate and regular rhythm.     Pulses: Normal pulses.     Heart sounds: No murmur heard. Pulmonary:     Effort: Pulmonary effort is normal.     Breath sounds: Normal breath sounds. No wheezing or rales.   Musculoskeletal:     Right lower leg: No edema.     Left lower leg: No edema.   Skin:    General: Skin is warm and dry.     Findings: Rash  present.     Comments: Scattered scaly erythematous patches, especially on palms with cracked fissures. Also present on elbows and posterior upper arms, shins.   Neurological:     Mental Status: She is alert. Mental status is at baseline.     Cranial Nerves: No facial asymmetry.     Motor: No tremor.   Psychiatric:        Mood and Affect: Mood normal.        Behavior: Behavior normal.      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 Doing well. CBD usage doesn't seem problematic to me. No relapse or craving. Toxassure today. PDMP reviewed, buprenorphine -naloxone  8-2 mg 1 film BID x 30 days. Follow-up in 3 months.      Relevant Medications   Buprenorphine  HCl-Naloxone  HCl (SUBOXONE ) 8-2 MG FILM (Start on 01/13/2024)   Other Relevant Orders   ToxAssure Select,+Antidepr,UR   Psoriasis - Primary   Worsening, especially on palms. On calcipotriol and halobetasol per dermatology but not helping. Requests referral to Dr. Livingston of dermatology, wonder if she's an immunotherapy candidate.      Relevant Orders   Ambulatory referral to Dermatology   Return in about  3 months (around 04/10/2024).  Ozell Kung MD 01/09/2024, 10:17 AM

## 2024-01-14 LAB — TOXASSURE SELECT,+ANTIDEPR,UR

## 2024-01-15 ENCOUNTER — Other Ambulatory Visit: Payer: Self-pay | Admitting: Student

## 2024-01-15 DIAGNOSIS — F1121 Opioid dependence, in remission: Secondary | ICD-10-CM

## 2024-01-15 NOTE — Telephone Encounter (Signed)
 Copied from CRM [id number]. Topic: Clinical - Medication Refill >> Jan 15, 2024 10:40 AM Cherylann S wrote: Medication: Buprenorphine  HCl-Naloxone  HCl (SUBOXONE ) 8-2  Has the patient contacted their pharmacy? Yes (Agent: If no, request that the patient contact the pharmacy for the refill. If patient does not wish to contact the pharmacy document the reason why and proceed with request.) (Agent: If yes, when and what did the pharmacy advise?)  This is the patient's preferred pharmacy:  [facility] #[id number] - [place], [place] - [address] [place] [address] Phone: [phone] Fax: [phone]  Is this the correct pharmacy for this prescription? Yes If no, delete pharmacy and type the correct one.   Has the prescription been filled recently? No  Is the patient out of the medication? No, 6 remaining  Has the patient been seen for an appointment in the last year OR does the patient have an upcoming appointment? Yes  Can we respond through MyChart? Yes  Agent: Please be advised that Rx refills may take up to 3 business days. We ask that you follow-up with your pharmacy.

## 2024-01-16 ENCOUNTER — Ambulatory Visit: Payer: Self-pay | Admitting: Student

## 2024-01-16 DIAGNOSIS — F1121 Opioid dependence, in remission: Secondary | ICD-10-CM

## 2024-01-16 MED ORDER — BUPRENORPHINE HCL-NALOXONE HCL 8-2 MG SL FILM: 1.0000 | ORAL_FILM | Freq: Two times a day (BID) | SUBLINGUAL | Status: DC

## 2024-01-16 MED ORDER — BUPRENORPHINE HCL-NALOXONE HCL 8-2 MG SL FILM: 1.0000 | ORAL_FILM | Freq: Two times a day (BID) | SUBLINGUAL | 1 refills | Status: DC

## 2024-01-16 NOTE — Telephone Encounter (Signed)
 Expected tox-assure results. Follow-up for opioid use disorder maintenance in 3 months (around end of September 2025). Sent future order for suboxone  with 1 refill to bridge until her routine follow-up.  Ozell Kung MD 01/16/2024, 7:07 AM

## 2024-03-08 ENCOUNTER — Other Ambulatory Visit: Payer: Self-pay | Admitting: Student

## 2024-03-08 DIAGNOSIS — F1121 Opioid dependence, in remission: Secondary | ICD-10-CM

## 2024-03-09 ENCOUNTER — Other Ambulatory Visit: Payer: Self-pay | Admitting: Student

## 2024-03-09 DIAGNOSIS — F1121 Opioid dependence, in remission: Secondary | ICD-10-CM

## 2024-03-09 MED ORDER — BUPRENORPHINE HCL-NALOXONE HCL 8-2 MG SL FILM
1.0000 | ORAL_FILM | Freq: Two times a day (BID) | SUBLINGUAL | 0 refills | Status: DC
Start: 2024-03-09 — End: 2024-04-15

## 2024-04-02 ENCOUNTER — Other Ambulatory Visit: Payer: Self-pay

## 2024-04-02 ENCOUNTER — Ambulatory Visit: Admitting: Student

## 2024-04-02 VITALS — BP 138/88 | HR 67 | Temp 97.8°F | Ht 64.0 in | Wt 185.0 lb

## 2024-04-02 DIAGNOSIS — E039 Hypothyroidism, unspecified: Secondary | ICD-10-CM

## 2024-04-02 DIAGNOSIS — Z13220 Encounter for screening for lipoid disorders: Secondary | ICD-10-CM

## 2024-04-02 DIAGNOSIS — E669 Obesity, unspecified: Secondary | ICD-10-CM

## 2024-04-02 DIAGNOSIS — R7303 Prediabetes: Secondary | ICD-10-CM

## 2024-04-02 DIAGNOSIS — I10 Essential (primary) hypertension: Secondary | ICD-10-CM | POA: Diagnosis not present

## 2024-04-02 DIAGNOSIS — Z114 Encounter for screening for human immunodeficiency virus [HIV]: Secondary | ICD-10-CM

## 2024-04-02 DIAGNOSIS — Z1159 Encounter for screening for other viral diseases: Secondary | ICD-10-CM

## 2024-04-02 DIAGNOSIS — Z6831 Body mass index (BMI) 31.0-31.9, adult: Secondary | ICD-10-CM

## 2024-04-02 DIAGNOSIS — Z0000 Encounter for general adult medical examination without abnormal findings: Secondary | ICD-10-CM

## 2024-04-02 DIAGNOSIS — F1121 Opioid dependence, in remission: Secondary | ICD-10-CM

## 2024-04-02 NOTE — Assessment & Plan Note (Signed)
 Patient's last TSH was checked a year prior with at 3.99.  She is on a current regimen of 137 mcg daily.  She denies symptoms of hypothyroidism today. Plan: - TSH ordered

## 2024-04-02 NOTE — Assessment & Plan Note (Signed)
 Patient presents with a history of hypertension with a blood pressure today of 141/94.  Patient reports that she did not take her losartan  today.  I suspect that her hypertension at this time is uncontrolled due to not taking her medications prior to the appointment.   Plan: -Continue current regimen of: has not taken losaratn 50 mg  -BMP today

## 2024-04-02 NOTE — Assessment & Plan Note (Signed)
 Last A1c was checked a year ago and was elevated at 5.7.  A1c ordered today

## 2024-04-02 NOTE — Patient Instructions (Signed)
 Thank you, Laurie Hutchinson [place] for allowing us  to provide your care today. Today we discussed chronic conditions.    I will call you with the lab results and we can discuss any changes on the phone! Keep up the great work!   I have ordered the following labs for you:   Lab Orders         Hemoglobin A1c         TSH         Lipid Profile         Basic metabolic panel with GFR         Hepatitis C Ab reflex to Quant PCR         HIV antibody (with reflex)        Referrals ordered today:   Referral Orders  No referral(s) requested today     I have ordered the following medication/changed the following medications:   Stop the following medications: There are no discontinued medications.   Start the following medications: No orders of the defined types were placed in this encounter.    Follow up: 3 months    Should you have any questions or concerns please call the internal medicine clinic at [phone].     Please note that our late policy has changed.  If you are more than 15 minutes late to your appointment, you may be asked to reschedule your appointment.  Dr. Kandis, D.O. [facility]

## 2024-04-02 NOTE — Progress Notes (Signed)
 Established Patient Office Visit  Subjective   Patient ID: Laurie Hutchinson, female    DOB: [birth year]  Age: 43 y.o. MRN: [id number]  Chief Complaint  Patient presents with   Follow-up    Routine 3 month follow up with medication refill / requesting labs    Laurie Hutchinson is a 43 y.o. who presents to the clinic for a follow-up of chronic conditions including hypertension, hypothyroidism, prediabetes, opioid use disorder. Please see problem based assessment and plan for additional details.   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 (HCC) 01/20/2023   Health care maintenance 01/20/2023   GERD (gastroesophageal reflux disease) 01/20/2023   Hypothyroidism 05/06/2019   Post menopausal problems 03/18/2019   Depression, major, single episode, in partial remission (HCC) 03/12/2017   Hot flashes due to surgical menopause 03/12/2017   Functional urinary incontinence 03/12/2017   Left leg paresthesias 10/30/2016       Objective:     BP 138/88 (BP Location: Right Arm, Patient Position: Sitting, Cuff Size: Normal) Comment (BP Location): patient request  Pulse 67   Temp 97.8 F (36.6 C) (Oral)   Ht 5' 4 (1.626 m)   Wt 185 lb (83.9 kg)   LMP 10/04/2016 (Exact Date)   SpO2 95%   BMI 31.76 kg/m  BP Readings from Last 3 Encounters:  04/02/24 138/88  01/09/24 125/85  10/29/23 (!) 150/68   Wt Readings from Last 3 Encounters:  04/02/24 185 lb (83.9 kg)  01/09/24 217 lb (98.4 kg)  10/29/23 228 lb 4.8 oz (103.6 kg)      Physical Exam Vitals reviewed.  Constitutional:      General: She is not in acute distress.     Appearance: She is not ill-appearing, toxic-appearing or diaphoretic.  Cardiovascular:     Rate and Rhythm: Normal rate and regular rhythm.     Heart sounds: No murmur heard. Pulmonary:     Effort: Pulmonary effort is normal. No respiratory distress.     Breath sounds: Normal breath sounds. No wheezing or rales.  Skin:    General: Skin is warm and dry.  Neurological:     Mental Status: She is alert.  Psychiatric:        Mood and Affect: Mood and affect normal.       Assessment & Plan:   Problem List Items Addressed This Visit       Cardiovascular and Mediastinum   Hypertension   Patient presents with a history of hypertension with a blood pressure today of 141/94.  Patient reports that she did not take her losartan  today.  I suspect that her hypertension at this time is uncontrolled due to not taking her medications prior to the appointment.   Plan: -Continue current regimen of: has not taken losaratn 50 mg  -BMP today        Relevant Orders   Basic metabolic panel with GFR     Endocrine   Hypothyroidism   Patient's last TSH was checked a year prior with at 3.99.  She is on a current regimen of 137 mcg daily.  She denies symptoms of hypothyroidism today. Plan: - TSH ordered  Relevant Orders   TSH     Other   Moderate opioid use disorder in early remission on maintenance therapy [facility])   Patient presents with a history of opoid use disorder on suboxone  8-2 mg BID. Patient denies adverse effects including nausea, vomiting, constipation. They denies relapses or cravings.  Plan: -Contract updated today -Prior toxassure was June 2025 and was appropriate: Will obtain tox assure us  at 6 months intervals unless indicated otherwise -Bowel regimen:  Takes over the counter stool softeners      Health care maintenance   Lipid profile ordered for hyperlipidemia screening and an obese patient with a history of prediabetes.  HIV screening and hep C screening collected  today.       Prediabetes - Primary   Last A1c was checked a year ago and was elevated at 5.7.  A1c ordered today      Relevant Orders   Hemoglobin A1c   Obesity (BMI 30-39.9)   Patient presents with a history of obesity.  On today's exam, she has lost almost 40 pounds since June.  Patient reports that this is intentional and that she has lost almost 40 pounds due to cutting out unhealthy food.      Other Visit Diagnoses       Screening for hyperlipidemia       Relevant Orders   Lipid Profile     Screening for HIV (human immunodeficiency virus)       Relevant Orders   HIV antibody (with reflex)     Need for hepatitis C screening test       Relevant Orders   Hepatitis C Ab reflex to Quant PCR       Return in about 3 months (around 07/02/2024) for OUD, HTN.    Damien Lease, DO

## 2024-04-02 NOTE — Assessment & Plan Note (Signed)
 Patient presents with a history of opoid use disorder on suboxone  8-2 mg BID. Patient denies adverse effects including nausea, vomiting, constipation. They denies relapses or cravings.  Plan: -Contract updated today -Prior toxassure was June 2025 and was appropriate: Will obtain tox assure us  at 6 months intervals unless indicated otherwise -Bowel regimen:  Takes over the counter stool softeners

## 2024-04-02 NOTE — Assessment & Plan Note (Addendum)
 Lipid profile ordered for hyperlipidemia screening and an obese patient with a history of prediabetes.  HIV screening and hep C screening collected today.

## 2024-04-02 NOTE — Assessment & Plan Note (Signed)
 Patient presents with a history of obesity.  On today's exam, she has lost almost 40 pounds since June.  Patient reports that this is intentional and that she has lost almost 40 pounds due to cutting out unhealthy food.

## 2024-04-03 LAB — BASIC METABOLIC PANEL WITH GFR
BUN/Creatinine Ratio: 21 (ref 9–23)
BUN: 14 mg/dL (ref 6–24)
CO2: 23 mmol/L (ref 20–29)
Calcium: 9.6 mg/dL (ref 8.7–10.2)
Chloride: 101 mmol/L (ref 96–106)
Creatinine, Ser: 0.67 mg/dL (ref 0.57–1.00)
Glucose: 89 mg/dL (ref 70–99)
Potassium: 4.6 mmol/L (ref 3.5–5.2)
Sodium: 139 mmol/L (ref 134–144)
eGFR: 110 mL/min/1.73 (ref >59)

## 2024-04-03 LAB — LIPID PANEL
Chol/HDL Ratio: 2.8 ratio (ref 0.0–4.4)
Cholesterol, Total: 139 mg/dL (ref 100–199)
HDL: 50 mg/dL (ref >39)
LDL Chol Calc (NIH): 75 mg/dL (ref 0–99)
Triglycerides: 69 mg/dL (ref 0–149)
VLDL Cholesterol Cal: 14 mg/dL (ref 5–40)

## 2024-04-03 LAB — HCV AB W REFLEX TO QUANT PCR: HCV Ab: NONREACTIVE

## 2024-04-03 LAB — HIV ANTIBODY (ROUTINE TESTING W REFLEX): HIV Screen 4th Generation wRfx: NONREACTIVE

## 2024-04-03 LAB — HEMOGLOBIN A1C
Est. average glucose Bld gHb Est-mCnc: 131 mg/dL
Hgb A1c MFr Bld: 6.2 % — ABNORMAL HIGH (ref 4.8–5.6)

## 2024-04-03 LAB — TSH: TSH: 0.653 u[IU]/mL (ref 0.450–4.500)

## 2024-04-03 LAB — HCV INTERPRETATION

## 2024-04-07 ENCOUNTER — Ambulatory Visit: Payer: Self-pay | Admitting: Student

## 2024-04-10 NOTE — Progress Notes (Signed)
 Internal Medicine Clinic Attending  Case discussed with the resident at the time of the visit.  We reviewed the resident's history and exam and pertinent patient test results.  I agree with the assessment, diagnosis, and plan of care documented in the resident's note.

## 2024-04-14 ENCOUNTER — Other Ambulatory Visit: Payer: Self-pay

## 2024-04-14 DIAGNOSIS — F1121 Opioid dependence, in remission: Secondary | ICD-10-CM

## 2024-04-14 NOTE — Telephone Encounter (Signed)
 Copied from CRM #[id number]. Topic: Clinical - Prescription Issue >> Apr 14, 2024  4:32 PM Zane F wrote: Reason for CRM:   Prescription In Question: Buprenorphine  HCl-Naloxone  HCl 8-2 MG FILM   Preferred Pharmacy: [facility] #[id number] - [place], [place] - [address] AT [address], [place] [address] Phone: [phone]  Fax: [phone]  Patient called in and stated that the provider listed is not an authorized user and they require the DEA number of an attending provider to be listed as the prescribing provider. Please resubmit prescription with this information and contact the patient once it is completed.   Callback Number: [id number] ( secure line)

## 2024-04-15 ENCOUNTER — Other Ambulatory Visit: Payer: Self-pay | Admitting: Family Medicine

## 2024-04-15 DIAGNOSIS — F1121 Opioid dependence, in remission: Secondary | ICD-10-CM

## 2024-04-15 MED ORDER — BUPRENORPHINE HCL-NALOXONE HCL 8-2 MG SL FILM: 1.0000 | ORAL_FILM | Freq: Two times a day (BID) | SUBLINGUAL | 0 refills | Status: DC

## 2024-04-15 NOTE — Progress Notes (Signed)
 See prior Rx request.  Was unable to refill in that encounter due to missing address?

## 2024-04-15 NOTE — Addendum Note (Signed)
 Addended by: CAMMIE GAETANA DEL on: 04/15/2024 11:28 AM   Modules accepted: Orders

## 2024-04-15 NOTE — Telephone Encounter (Signed)
 Unable to refill in this encounter due to unable to update practice address?  Created new encounter and refilled .  Sent patient MyChart message   Laurie Hutchinson

## 2024-04-15 NOTE — Addendum Note (Signed)
 Addended by: JEANELLE NA L on: 04/15/2024 01:37 PM   Modules accepted: Orders

## 2024-05-21 ENCOUNTER — Other Ambulatory Visit: Payer: Self-pay | Admitting: Student

## 2024-05-21 DIAGNOSIS — F1121 Opioid dependence, in remission: Secondary | ICD-10-CM

## 2024-05-21 NOTE — Telephone Encounter (Signed)
 Last rx written 04/15/24. Last OV - 04/02/24. Next OV - 06/23/24. TOX - 01/09/24.

## 2024-05-21 NOTE — Telephone Encounter (Signed)
 Copied from CRM #[id number]. Topic: Clinical - Medication Refill >> May 21, 2024  9:25 AM Cherylann RAMAN wrote: Medication: Buprenorphine  HCl-Naloxone  HCl 8-2 MG FILM  Has the patient contacted their pharmacy? Yes (Agent: If no, request that the patient contact the pharmacy for the refill. If patient does not wish to contact the pharmacy document the reason why and proceed with request.) (Agent: If yes, when and what did the pharmacy advise?) Have not received a response   This is the patient's preferred pharmacy:  [facility] #[id number] - [place], [place] - [address] AT [address] [place] [address] Phone: [phone] Fax: [phone]  Is this the correct pharmacy for this prescription? Yes If no, delete pharmacy and type the correct one.   Has the prescription been filled recently? No  Is the patient out of the medication? Yes  Has the patient been seen for an appointment in the last year OR does the patient have an upcoming appointment? Yes  Can we respond through MyChart? Yes  Agent: Please be advised that Rx refills may take up to 3 business days. We ask that you follow-up with your pharmacy.

## 2024-05-22 MED ORDER — BUPRENORPHINE HCL-NALOXONE HCL 8-2 MG SL FILM: 1.0000 | ORAL_FILM | Freq: Two times a day (BID) | SUBLINGUAL | 0 refills | Status: DC

## 2024-05-24 ENCOUNTER — Other Ambulatory Visit: Payer: Self-pay | Admitting: Student

## 2024-05-24 ENCOUNTER — Telehealth: Payer: Self-pay | Admitting: *Deleted

## 2024-05-24 DIAGNOSIS — F1121 Opioid dependence, in remission: Secondary | ICD-10-CM

## 2024-05-24 MED ORDER — BUPRENORPHINE HCL-NALOXONE HCL 8-2 MG SL FILM: 1.0000 | ORAL_FILM | Freq: Two times a day (BID) | SUBLINGUAL | 0 refills | Status: DC

## 2024-05-24 NOTE — Telephone Encounter (Signed)
 Refill has been done.  Copied from CRM #[id number]. Topic: Clinical - Medication Refill >> May 21, 2024  9:25 AM Cherylann RAMAN wrote: Medication: Buprenorphine  HCl-Naloxone  HCl 8-2 MG FILM  Has the patient contacted their pharmacy? Yes (Agent: If no, request that the patient contact the pharmacy for the refill. If patient does not wish to contact the pharmacy document the reason why and proceed with request.) (Agent: If yes, when and what did the pharmacy advise?) Have not received a response   This is the patient's preferred pharmacy:  [facility] #[id number] - [place], [place] - [address] AT [address] [place] [address] Phone: [phone] Fax: [phone]  Is this the correct pharmacy for this prescription? Yes If no, delete pharmacy and type the correct one.   Has the prescription been filled recently? No  Is the patient out of the medication? Yes  Has the patient been seen for an appointment in the last year OR does the patient have an upcoming appointment? Yes  Can we respond through MyChart? Yes  Agent: Please be advised that Rx refills may take up to 3 business days. We ask that you follow-up with your pharmacy. >> May 24, 2024  8:35 AM Susanna ORN wrote: Patient called in stating that she found out over the weekend that [facility] in [place] is closed and was told that they don't know when they will reopen. She's wanting to know if her refill request can now be sent over to [facility] on [address] in [place] instead.

## 2024-05-24 NOTE — Progress Notes (Signed)
 Resent patients Suboxone , pharmacy it was sent to is closed.   Sent to [facility] in [place].   PDMP appropriate, refill sent in. Prior refill sent to the closed pharmacy has been canceled.

## 2024-06-23 ENCOUNTER — Ambulatory Visit: Payer: Self-pay | Admitting: Student

## 2024-06-23 VITALS — BP 115/77 | HR 87 | Temp 97.8°F | Ht 64.0 in | Wt 179.8 lb

## 2024-06-23 DIAGNOSIS — F1121 Opioid dependence, in remission: Secondary | ICD-10-CM

## 2024-06-23 DIAGNOSIS — Z1231 Encounter for screening mammogram for malignant neoplasm of breast: Secondary | ICD-10-CM

## 2024-06-23 DIAGNOSIS — E039 Hypothyroidism, unspecified: Secondary | ICD-10-CM

## 2024-06-23 DIAGNOSIS — I10 Essential (primary) hypertension: Secondary | ICD-10-CM

## 2024-06-23 MED ORDER — BUPRENORPHINE HCL-NALOXONE HCL 8-2 MG SL FILM: 1.0000 | ORAL_FILM | Freq: Two times a day (BID) | SUBLINGUAL | 2 refills | Status: AC

## 2024-06-23 NOTE — Assessment & Plan Note (Deleted)
 SABRA

## 2024-06-23 NOTE — Patient Instructions (Signed)
 VISIT SUMMARY: During your follow-up visit, we discussed your resolved reflux symptoms, opioid use and pain management, thyroid  disease, weight loss, prediabetes, and breast pain. We also reviewed your current medications and health maintenance plans.  YOUR PLAN: MODERATE OPIOID USE DISORDER IN EARLY REMISSION ON MAINTENANCE THERAPY: Your opioid use disorder is well-managed with buprenorphine , and you have not experienced any cravings. -I have provided two refills of buprenorphine , timed to start on Saturday to accommodate potential pharmacy closures. -Please contact the office if you encounter any issues with the pharmacy.  HYPOTHYROIDISM: Your hypothyroidism is being managed with levothyroxine . -Continue your current levothyroxine  regimen. -Avoid hair and nail supplements containing biotin for one week before your thyroid  function tests.  PRIMARY HYPERTENSION: Your blood pressure is well-controlled with your current medication. -Continue your current antihypertensive regimen.  GENERAL HEALTH MAINTENANCE: We discussed breast cancer screening and your efforts to manage weight and prevent diabetes. -I have ordered a mammogram for breast cancer screening. -Continue your current dietary and exercise regimen to manage your weight and prevent diabetes.  Take your prescription medications as usual on the day of your doctor visit. Unless specifically instructed, there is no need to fast prior to laboratory blood testing.  Bring all of the medications that you take (including over the counter medications and supplements) with you to every clinic visit.  This after visit summary is an important review of tests, referrals, and medication changes that were discussed during your visit. If you have questions or concerns, call [phone]. Outside of clinic business hours, call the main hospital at [phone] and ask the operator for the on-call internal medicine resident.   Ozell Kung  MD 06/23/2024, 9:06 AM

## 2024-06-23 NOTE — Assessment & Plan Note (Addendum)
 Stable. Doing well. No relapse or craving. PDMP reviewed and is appropriate. Buprenorphine -naloxone  8-2 mg 1 film BID x 30 days with 2 refills. Follow-up in 3 months. Orders:   Buprenorphine  HCl-Naloxone  HCl 8-2 MG FILM; Place 1 Film under the tongue in the morning and at bedtime.

## 2024-06-23 NOTE — Assessment & Plan Note (Addendum)
 BP Readings from Last 3 Encounters:  06/23/24 115/77  04/02/24 138/88  01/09/24 125/85   Chronic and stable.  Continue losartan  50 mg daily.

## 2024-06-23 NOTE — Assessment & Plan Note (Addendum)
 TSH  Date Value Ref Range Status  04/02/2024 0.653 0.450 - 4.500 uIU/mL Final  05/13/2023 3.990 0.450 - 4.500 uIU/mL Final  04/15/2023 7.120 (H) 0.450 - 4.500 uIU/mL Final   Chronic and stable on 137 mcg of Synthroid  daily.  Check TSH in 3 months.  She needs to stop all biotin containing supplements for a week prior to any laboratory testing.  Orders:   TSH; Future

## 2024-06-23 NOTE — Progress Notes (Signed)
 Patient name: Laurie Hutchinson Date of birth: September 23, 1980 Date of visit: 06/23/24  Subjective  Reason for visit: Follow-up (3 month fu visit for OUD and blood pressure./Discuss about taking new vitamin. ) and Medication Refill  Discussed the use of AI scribe software for clinical note transcription with the patient, who gave verbal consent to proceed.  History of Present Illness   Laurie Hutchinson is a 43 year old female who presents for a routine follow-up visit.  Gastroesophageal reflux symptoms - Reflux symptoms have resolved following weight loss. - Pantoprazole  discontinued without recurrence of symptoms.  Opioid use and pain management - No cravings for Percocet or other opioids. - Three to four days of medication remaining and requests refill.  Thyroid  disease - Takes thyroid  medication with one refill remaining. - Takes high-biotin hair and nail vitamin twice daily, 4 hours after thyroid  medication. - Also takes a women's multivitamin.  Weight loss and prediabetes - Lost approximately 40 pounds since June through dietary changes and regular cardio and strength exercise. - Has prediabetes and is highly motivated to avoid progression to diabetes after witnessing its effects on her late ex-fianc.  Breast pain and imaging - Previously experienced left breast pain, which has resolved. - Mammogram and ultrasound were ordered but not completed after pain resolution. - Has dense breast tissue requiring ultrasound with mammograms. - No family history of breast cancer.       Outpatient Medications Prior to Visit  Medication Sig   calcipotriene (DOVONOX) 0.005 % cream Apply 1 Application topically daily.   celecoxib (CELEBREX) 100 MG capsule Take 100 mg by mouth 2 (two) times daily.   fexofenadine  (ALLEGRA  ALLERGY) 180 MG tablet Take 1 tablet (180 mg total) by mouth daily.   fluticasone  (FLONASE ) 50 MCG/ACT nasal spray Place 1 spray into both nostrils daily.    halobetasol (ULTRAVATE) 0.05 % cream Apply 1 each topically 2 (two) times daily.   levothyroxine  (SYNTHROID ) 137 MCG tablet Take 1 tablet (137 mcg total) by mouth daily before breakfast.   losartan  (COZAAR ) 50 MG tablet Take 1 tablet (50 mg total) by mouth daily.   mupirocin ointment (BACTROBAN) 2 % Apply 1 Application topically 2 (two) times daily.   pantoprazole  (PROTONIX ) 40 MG tablet Take 1 tablet (40 mg total) by mouth 2 (two) times daily.   [DISCONTINUED] Buprenorphine  HCl-Naloxone  HCl 8-2 MG FILM Place 1 Film under the tongue in the morning and at bedtime.   No facility-administered medications prior to visit.     Objective  Today's Vitals   06/23/24 0827  BP: 115/77  Pulse: 87  Temp: 97.8 F (36.6 C)  TempSrc: Oral  SpO2: 96%  Weight: 179 lb 12.8 oz (81.6 kg)  Height: 5' 4 (1.626 m)  Body mass index is 30.86 kg/m.   Physical Exam Constitutional:      Appearance: Normal appearance.  Cardiovascular:     Rate and Rhythm: Normal rate and regular rhythm.     Pulses: Normal pulses.     Heart sounds: No murmur heard. Pulmonary:     Effort: Pulmonary effort is normal. No respiratory distress.  Skin:    General: Skin is warm and dry.  Neurological:     Mental Status: She is alert.     Cranial Nerves: No facial asymmetry.  Psychiatric:        Mood and Affect: Affect normal.        Speech: Speech normal.        Behavior: Behavior normal.  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 Doing well. No relapse or craving. PDMP reviewed and is appropriate. Buprenorphine -naloxone  8-2 mg 1 film BID x 30 days with 2 refills. Follow-up in 3 months. Orders:   Buprenorphine  HCl-Naloxone  HCl 8-2 MG FILM; Place 1 Film under the tongue in the morning and at bedtime.   Hypothyroidism, unspecified type TSH  Date Value Ref Range Status  04/02/2024 0.653 0.450 - 4.500 uIU/mL Final  05/13/2023 3.990 0.450 - 4.500 uIU/mL  Final  04/15/2023 7.120 (H) 0.450 - 4.500 uIU/mL Final   Chronic and stable on 137 mcg of Synthroid  daily.  Check TSH in 3 months.  She needs to stop all biotin containing supplements for a week prior to any laboratory testing.  Orders:   TSH; Future   Primary hypertension BP Readings from Last 3 Encounters:  06/23/24 115/77  04/02/24 138/88  01/09/24 125/85   Chronic and stable.  Continue losartan  50 mg daily.     Encounter for screening mammogram for malignant neoplasm of breast  Orders:   MM 3D SCREENING MAMMOGRAM BILATERAL BREAST; Future  Return in about 3 months (around 09/21/2024).  Ozell Kung MD 06/23/2024, 11:32 AM

## 2024-06-30 NOTE — Progress Notes (Signed)
 Internal Medicine Clinic Attending Patient was precepted with Dr. Karna who has since gone out on medical leave.  I have reviewed the residents history and exam and pertinent patient test results.  I agree with the assessment, diagnosis, and plan of care documented in the residents note.

## 2024-07-22 ENCOUNTER — Telehealth: Payer: Self-pay | Admitting: *Deleted

## 2024-07-22 NOTE — Telephone Encounter (Signed)
 Mammogram appoitment 08-13-2024 12:50 pm to arrive 1:30 pm/ breast center  [phone] / appointment mailed to the patient with the information regarding the 75:00 no show fee.

## 2024-08-13 ENCOUNTER — Ambulatory Visit
Admission: RE | Admit: 2024-08-13 | Discharge: 2024-08-13 | Disposition: A | Source: Ambulatory Visit | Attending: Internal Medicine

## 2024-08-13 DIAGNOSIS — Z1231 Encounter for screening mammogram for malignant neoplasm of breast: Secondary | ICD-10-CM

## 2024-08-17 ENCOUNTER — Other Ambulatory Visit: Payer: Self-pay | Admitting: Medical Genetics

## 2024-08-23 ENCOUNTER — Other Ambulatory Visit (HOSPITAL_COMMUNITY)
